# Patient Record
Sex: Female | Born: 1990 | Race: Black or African American | Hispanic: No | Marital: Married | Smoking: Never smoker
Health system: Southern US, Community
[De-identification: ages and names within clinical notes are randomized; demographics above are authoritative.]

## PROBLEM LIST (undated history)

## (undated) DIAGNOSIS — R7871 Abnormal lead level in blood: Secondary | ICD-10-CM

## (undated) DIAGNOSIS — O1490 Unspecified pre-eclampsia, unspecified trimester: Secondary | ICD-10-CM

## (undated) DIAGNOSIS — K0889 Other specified disorders of teeth and supporting structures: Secondary | ICD-10-CM

## (undated) HISTORY — DX: Abnormal lead level in blood: R78.71

## (undated) HISTORY — DX: Unspecified pre-eclampsia, unspecified trimester: O14.90

## (undated) HISTORY — DX: Other specified disorders of teeth and supporting structures: K08.89

---

## 2019-07-16 HISTORY — DX: Maternal care for unspecified type scar from previous cesarean delivery: O34.219

## 2019-10-27 ENCOUNTER — Emergency Department (HOSPITAL_COMMUNITY)
Admission: EM | Admit: 2019-10-27 | Discharge: 2019-10-27 | Disposition: A | Discharge status: Home / Self Care | Payer: Medicaid Other | Attending: Emergency Medicine | Admitting: Emergency Medicine

## 2019-10-27 ENCOUNTER — Encounter (HOSPITAL_COMMUNITY): Payer: Self-pay

## 2019-10-27 ENCOUNTER — Other Ambulatory Visit: Payer: Self-pay

## 2019-10-27 DIAGNOSIS — K0889 Other specified disorders of teeth and supporting structures: Secondary | ICD-10-CM | POA: Diagnosis not present

## 2019-10-27 DIAGNOSIS — Z3A01 Less than 8 weeks gestation of pregnancy: Secondary | ICD-10-CM | POA: Insufficient documentation

## 2019-10-27 DIAGNOSIS — O26891 Other specified pregnancy related conditions, first trimester: Secondary | ICD-10-CM | POA: Insufficient documentation

## 2019-10-27 LAB — I-STAT BETA HCG BLOOD, ED (MC, WL, AP ONLY): I-stat hCG, quantitative: >2000 m[IU]/mL — ABNORMAL HIGH (ref <5)

## 2019-10-27 NOTE — Discharge Instructions (Addendum)
You were seen in the ER for tooth pain. I did not see any signs of infection in your mouth, you most likely irritated a crown or a nerve while you were eating. Continue to take over the counter Tylenol for pain. Do not take Ibuprofen/ Advil for pain as this is dangerous in pregnancy. I have provided a phone number and address for a Dentist in [facility]. Please schedule an appointment as soon as possible. You also received a blood pregnancy test here in the ER. You may check the results via MyChart. I have referred you to a women's clinic here in [place]. Please call and schedule an appointment today. Return to the ER if your symptoms worsen.

## 2019-10-27 NOTE — ED Triage Notes (Signed)
Pt reports 2 weeks of left upper dental pain, states she is about [redacted] weeks pregnant, no complaints related to pregnancy

## 2019-10-27 NOTE — ED Notes (Signed)
Patient given discharge instructions patient verbalizes understanding. 

## 2019-10-27 NOTE — ED Provider Notes (Signed)
[place] EMERGENCY DEPARTMENT Provider Note   CSN: [id number] Arrival date & time: 10/27/19  1134     History Chief Complaint  Patient presents with  . Dental Pain    Kathryn Rollins is a 29 y.o. female.  HPI 29 year old female with no significant medical history presents ER for tooth pain and stating that she is approximately [redacted] weeks pregnant.  History provided by patient and husband over the phone.  She states that on Sunday she was eating, felt like bit down on her food wrong, and since then has been having left-sided known localized jaw pain.  She cannot locate it to a specific area, states that it is in both upper and lower jaw.  She denies any fevers, chills, drooling, difficulty swallowing, headaches, ear pain.  She states that she is approximately [redacted] weeks pregnant per home pregnancy test, and does not have an OB/GYN that she follows with.  LMP 09/19/2019.  She denies any abdominal pain, abnormal vaginal bleeding, discharge, pain, nausea, vomiting.       History reviewed. No pertinent past medical history.  There are no problems to display for this patient.   History reviewed. No pertinent surgical history.   OB History    Gravida  1   Para      Term      Preterm      AB      Living        SAB      TAB      Ectopic      Multiple      Live Births              No family history on file.  Social History   Tobacco Use  . Smoking status: Not on file  Substance Use Topics  . Alcohol use: Not on file  . Drug use: Not on file    Home Medications Prior to Admission medications   Medication Sig Start Date End Date Taking? Authorizing Provider  acetaminophen (TYLENOL) 500 MG tablet Take 500 mg by mouth every 6 (six) hours as needed for mild pain.   Yes [provider]    Allergies    Patient has no known allergies.  Review of Systems   Review of Systems  Constitutional: Negative for chills, fatigue and fever.    HENT: Positive for dental problem. Negative for drooling, ear pain, facial swelling, mouth sores, sinus pain, sore throat, trouble swallowing and voice change.   Respiratory: Negative for cough and shortness of breath.   Cardiovascular: Negative for chest pain, palpitations and leg swelling.  Gastrointestinal: Negative for abdominal pain, diarrhea, nausea and vomiting.  Genitourinary: Negative for dysuria, menstrual problem, pelvic pain, vaginal bleeding, vaginal discharge and vaginal pain.  Musculoskeletal: Negative for back pain.  Skin: Negative for wound.  Neurological: Negative for speech difficulty.    Physical Exam Updated Vital Signs BP 111/78 (BP Location: Right Arm)   Pulse (!) 108   Temp 98.7 F (37.1 C) (Oral)   Resp 12   LMP 09/28/2019 (Approximate)   SpO2 99%   Physical Exam Constitutional:      General: She is not in acute distress.    Appearance: Normal appearance. She is normal weight. She is not ill-appearing or toxic-appearing.  HENT:     Head: Normocephalic and atraumatic.     Ears:     Comments: No mastoid tenderness bilaterally    Mouth/Throat:     Lips: Pink.  Mouth: Mucous membranes are moist. No lacerations, oral lesions or angioedema.     Dentition: Abnormal dentition. Does not have dentures. Dental tenderness present. No gingival swelling, dental caries or dental abscesses.     Tongue: No lesions. Tongue does not deviate from midline.     Palate: No mass and lesions.     Pharynx: No pharyngeal swelling, oropharyngeal exudate, posterior oropharyngeal erythema or uvula swelling.     Tonsils: No tonsillar exudate or tonsillar abscesses.     Comments: Moderate dentition, patient missing several teeth.  Noticeable crown on tooth #17, missing tooth 18, 19, 32, 30.  No noticeable dental caries, rotting teeth, erythema along gumline.  No pus, wounds, abscesses, drainage.  Gums pink and moist, no gingival swelling, no signs of infection.  Uvula midline, no  tonsillar swelling or exudate.  No wounds, ulcerations to outer cheeks bilaterally.  Tongue moist, pink, with no lesions.  No parotid submandibular swelling.  No dentures.  Patient tolerating secretions well.  Diffuse tenderness to left lower and upper jaw  Eyes:     Extraocular Movements: Extraocular movements intact.     Conjunctiva/sclera: Conjunctivae normal.     Pupils: Pupils are equal, round, and reactive to light.  Cardiovascular:     Rate and Rhythm: Normal rate and regular rhythm.     Pulses: Normal pulses.     Heart sounds: Normal heart sounds.  Pulmonary:     Effort: Pulmonary effort is normal.     Breath sounds: Normal breath sounds.  Abdominal:     General: Abdomen is flat.     Palpations: Abdomen is soft.     Tenderness: There is no abdominal tenderness. There is no guarding.  Musculoskeletal:        General: Normal range of motion.  Skin:    General: Skin is warm and dry.     Findings: No bruising or lesion.  Neurological:     General: No focal deficit present.     Mental Status: She is alert.  Psychiatric:        Mood and Affect: Mood normal.        Behavior: Behavior normal.     ED Results / Procedures / Treatments   Labs (all labs ordered are listed, but only abnormal results are displayed) Labs Reviewed  I-STAT BETA HCG BLOOD, ED ([facility], [facility], [facility] ONLY) - Abnormal; Notable for the following components:      Result Value   I-stat hCG, quantitative >2,000.0 (*)    All other components within normal limits    EKG None  Radiology No results found.  Procedures Procedures (including critical care time)  Medications Ordered in ED Medications - No data to display  ED Course  I have reviewed the triage vital signs and the nursing notes.  Pertinent labs & imaging results that were available during my care of the patient were reviewed by me and considered in my medical decision making (see chart for details).    MDM Rules/Calculators/A&P                       29 year old female with tooth pain and new pregnancy. On presentation to the ER, patient is well-appearing, no acute distress, vitals nonconcerning.  There is no tripoding, drooling, patient able to speak in full sentences, tolerating secretions well.  On inspection, I do not see any fluctuance, erythema along gumlines, no signs of tooth infection in upper and the lower left side of her jaw.  Doubt dental abscess, severe infection, Ludwig's Angina, pertionsillar abscess.  Patient denies injury to jaw.  Suspect nerve irritation/possible muscle injury due to chewing.  I do not think antibiotics are indicated at this time.  Patient states that she has been taking Tylenol for pain which has been helpful.  Encouraged her to continue to take this, warned her about NSAID use in pregnancy.  Will refer to dentist for further treatment, return precautions given.  Patient requesting pregnancy test in ED.  Ordered i-STAT blood pregnancy test pending.  Patient informed she can look-up results in MyChart.  Patient does not endorse any abnormal vaginal bleeding, vaginal discharge, fluid rush, abdominal pain, excessive nausea or vomiting.  No red flags indicating threatened pregnancy.  LMP 09/19/2019.  Positive home pregnancy test. Will refer patient to outpatient women's clinic for further management of her pregnancy.     At this stage the patient has been appropriately medically screened and stable for discharge. She voices understanding and is agreable to this plan.  Final Clinical Impression(s) / ED Diagnoses Final diagnoses:  Pain, dental  Less than [redacted] weeks gestation of pregnancy    Rx / DC Orders ED Discharge Orders    None       Leone Brand 10/27/19 1315    Margarita Grizzle, MD 10/28/19 [phone]

## 2019-10-28 ENCOUNTER — Telehealth: Payer: Self-pay | Admitting: *Deleted

## 2019-10-28 ENCOUNTER — Telehealth: Payer: Self-pay | Admitting: Family Medicine

## 2019-10-28 NOTE — Telephone Encounter (Signed)
Pt husband called regarding Rx for wife.  RNCM reviewed chart to find that there was not Rx written for pt.  RNCM relayed information to husband.

## 2019-10-28 NOTE — Telephone Encounter (Signed)
Received a call from the patient's husband. He wanted her to see a doctor today because she has a toothache, and the dentist will not see her without a note. I informed him due to her being so early pregnant, we could not see her today to give her a note. However, we could get her scheduled to start care at 8 weeks.

## 2019-10-30 ENCOUNTER — Other Ambulatory Visit: Payer: Self-pay

## 2019-10-30 ENCOUNTER — Emergency Department (HOSPITAL_COMMUNITY)
Admission: EM | Admit: 2019-10-30 | Discharge: 2019-10-30 | Disposition: A | Discharge status: Home / Self Care | Payer: Medicaid Other | Attending: Emergency Medicine | Admitting: Emergency Medicine

## 2019-10-30 ENCOUNTER — Encounter (HOSPITAL_COMMUNITY): Payer: Self-pay

## 2019-10-30 DIAGNOSIS — O99891 Other specified diseases and conditions complicating pregnancy: Secondary | ICD-10-CM | POA: Diagnosis present

## 2019-10-30 DIAGNOSIS — K0889 Other specified disorders of teeth and supporting structures: Secondary | ICD-10-CM | POA: Insufficient documentation

## 2019-10-30 DIAGNOSIS — R519 Headache, unspecified: Secondary | ICD-10-CM | POA: Insufficient documentation

## 2019-10-30 DIAGNOSIS — Z3A01 Less than 8 weeks gestation of pregnancy: Secondary | ICD-10-CM | POA: Insufficient documentation

## 2019-10-30 MED ORDER — ACETAMINOPHEN 500 MG PO TABS
1000.0000 mg | ORAL_TABLET | Freq: Once | ORAL | Status: AC
  Administered 2019-10-30: 17:00:00 1000 mg via ORAL
  Filled 2019-10-30: qty 2

## 2019-10-30 NOTE — ED Triage Notes (Addendum)
Patient complains of ongoing dental pain left upper for more than 2 weeks, also reports early pregnancy approximately 4-5 weeks. Interpretor service used for triage assessment. Patient has taken antibiotic as prescribed and the dentist want see until cleared by OB

## 2019-10-30 NOTE — ED Provider Notes (Signed)
[facility] EMERGENCY DEPARTMENT Provider Note   CSN: [id number] Arrival date & time: 10/30/19  1414     History Chief Complaint  Patient presents with  . Dental Pain    Kathryn Rollins is a 29 y.o. female.  Kathryn Rollins is a 29 y.o. female who is currently 4-[redacted] weeks pregnant, presents to the emergency department with continued dental pain.  She states she has been dealing with this pain for 3 weeks now.  She was seen in the emergency department a few days ago and instructed to take Tylenol and follow-up with a dentist.  The dentist started her on amoxicillin, but stated that she would need OB clearance before they could do a dental extraction.  Patient reports she called the faculty practice women's clinic but they were not able to schedule an appointment for her to be seen yet because she is too early in her pregnancy and stated she would need to be at least [redacted] weeks along.  She is here today because she does not think she can continue to deal with this pain for 4 more weeks until she can be seen by OB/GYN.  She denies any associated facial swelling, fevers or chills.  Tylenol does provide some relief until it wears off.  She last took a dose this morning.  She has been taking the amoxicillin as directed.  She reports pain radiates up the side of her face.  She has not had any difficulty swallowing or pain or swelling underneath the tongue.  No other aggravating or alleviating factors.  Patient is [place] speaking, interpreter used to obtain history        History reviewed. No pertinent past medical history.  There are no problems to display for this patient.   History reviewed. No pertinent surgical history.   OB History    Gravida  1   Para      Term      Preterm      AB      Living        SAB      TAB      Ectopic      Multiple      Live Births              No family history on file.  Social History   Tobacco Use  . Smoking  status: Not on file  Substance Use Topics  . Alcohol use: Not on file  . Drug use: Not on file    Home Medications Prior to Admission medications   Medication Sig Start Date End Date Taking? Authorizing Provider  acetaminophen (TYLENOL) 500 MG tablet Take 500 mg by mouth every 6 (six) hours as needed for mild pain.    [provider]    Allergies    Patient has no known allergies.  Review of Systems   Review of Systems  Constitutional: Negative for chills and fever.  HENT: Positive for dental problem. Negative for facial swelling, sore throat, trouble swallowing and voice change.   Musculoskeletal: Negative for neck pain and neck stiffness.  Skin: Negative for color change and rash.  Neurological: Positive for headaches.    Physical Exam Updated Vital Signs BP 105/66 (BP Location: Right Arm)   Pulse 90   Temp 98.4 F (36.9 C) (Oral)   Resp 14   LMP 09/28/2019 (Approximate)   SpO2 100%   Physical Exam Vitals and nursing note reviewed.  Constitutional:  General: She is not in acute distress.    Appearance: Normal appearance. She is well-developed and normal weight. She is not ill-appearing or diaphoretic.     Comments: Tearful but in no acute distress  HENT:     Head: Normocephalic and atraumatic.     Mouth/Throat:     Comments: Tenderness to palpation over the left upper posterior molar with no surrounding erythema, swelling or obvious abscess, no tenderness over the roof of the mouth, posterior oropharynx is clear, normal phonation, tolerating secretions without difficulty, no sublingual tenderness or induration. Eyes:     General:        Right eye: No discharge.        Left eye: No discharge.  Neck:     Comments: No neck pain, swelling or masses, normal range of motion, no torticollis, no stridor Pulmonary:     Effort: Pulmonary effort is normal. No respiratory distress.  Musculoskeletal:     Cervical back: Neck supple.  Neurological:     Mental  Status: She is alert and oriented to person, place, and time.     Coordination: Coordination normal.  Psychiatric:        Behavior: Behavior normal.     ED Results / Procedures / Treatments   Labs (all labs ordered are listed, but only abnormal results are displayed) Labs Reviewed - No data to display  EKG None  Radiology No results found.  Procedures Procedures (including critical care time)  Medications Ordered in ED Medications  acetaminophen (TYLENOL) tablet 1,000 mg (has no administration in time range)    ED Course  I have reviewed the triage vital signs and the nursing notes.  Pertinent labs & imaging results that were available during my care of the patient were reviewed by me and considered in my medical decision making (see chart for details).    MDM Rules/Calculators/A&P                     29 year old female here with continued dental pain for 3 weeks, was seen by dentist and started on amoxicillin, and on exam today she has no obvious signs of abscess, facial swelling, fevers or chills, exam is not concerning for Ludwick's angina.  Dentist told patient that she needed OB clearance before they could do an extraction, but patient has been told by OB/GYN office that she cannot be seen until she is at least [redacted] weeks along, and is currently only 4 to 5 weeks.  I discussed with MAU provider Vernice Jefferson who provided a list of other dentist for the patient to consult and also encourage patient to call back the OB/GYN office and explain the situation, she states they are usually able to provide patients with a letter with dental clearance and approve medications during pregnancy.  I provided patient with dental resource list pain gave her this information had long discussion using [place] interpreter and answered all possible questions.  Pain has improved with a dose of Tylenol here in the emergency department.  At this time patient is stable for discharge home.  Final  Clinical Impression(s) / ED Diagnoses Final diagnoses:  Pain, dental  Less than [redacted] weeks gestation of pregnancy    Rx / DC Orders ED Discharge Orders    None       Janet Berlin 10/30/19 1746    Quintella Reichert, MD 10/31/19 [phone]

## 2019-10-30 NOTE — ED Notes (Signed)
Patient verbalizes understanding of discharge instructions. Opportunity for questioning and answers were provided. Armband removed by staff, pt discharged from ED and ambulated to lobby to wait for family. 

## 2019-10-30 NOTE — Discharge Instructions (Signed)
Please continue taking your antibiotics and continue using Tylenol 1000 mg 3 times daily.  Call the OB/GYN office and explained to them that you need a dental procedure done but cannot be seen without dental clearance, you can also use the list of dental resources provided to try and find another dentist that can see you.

## 2019-12-06 ENCOUNTER — Other Ambulatory Visit: Payer: Self-pay

## 2019-12-06 ENCOUNTER — Ambulatory Visit (INDEPENDENT_AMBULATORY_CARE_PROVIDER_SITE_OTHER): Payer: Self-pay | Admitting: *Deleted

## 2019-12-06 DIAGNOSIS — Z789 Other specified health status: Secondary | ICD-10-CM

## 2019-12-06 DIAGNOSIS — O09299 Supervision of pregnancy with other poor reproductive or obstetric history, unspecified trimester: Secondary | ICD-10-CM

## 2019-12-06 DIAGNOSIS — Z3A00 Weeks of gestation of pregnancy not specified: Secondary | ICD-10-CM

## 2019-12-06 DIAGNOSIS — R7871 Abnormal lead level in blood: Secondary | ICD-10-CM

## 2019-12-06 DIAGNOSIS — Z603 Acculturation difficulty: Secondary | ICD-10-CM

## 2019-12-06 DIAGNOSIS — O0990 Supervision of high risk pregnancy, unspecified, unspecified trimester: Secondary | ICD-10-CM

## 2019-12-06 NOTE — Progress Notes (Signed)
I connected with  Kathryn Rollins on 12/06/19 at  9:15 AM EDT by telephone and verified that I am speaking with the correct person using two identifiers.   I discussed the limitations, risks, security and privacy concerns of performing an evaluation and management service by telephone and the availability of in person appointments. I also discussed with the patient that there may be a patient responsible charge related to this service. The patient expressed understanding and agreed to proceed.  I explained I am completing her New OB Intake today. We discussed per chart she has also scheduled appointment with [facility].  I confirmed she is going to get her prenatal care with [place] , not [facility]- she states she cancelled with [facility]. We discussed Her EDD and that it is based on  sure LMP . I reviewed her allergies, meds, OB History, Medical /Surgical history, and appropriate screenings.She is unsure of her pre- pregnancy weight.  I explained we will ask her to take her blood pressure weekly during her pregnancy. She does not have a cuff. She has no insurance but is going to apply  For medicaid. I Informed her we can order a blood pressure cuff prescription thru  [facility] once she has medicaid.   I explained she will have some visits in office and some virtually and that we will help her download the app when she is in the office next week.   I reviewed her new ob  appointment date/ time with her , our location and to wear mask, no visitors.  I explained she will have a pelvic exam, ob bloodwork, hemoglobin a1C, cbg , and  genetic testing if desired,- she is undecided about a panorama.( she had pap in 2019 )  I scheduled an [place] at 19 weeks and gave her the appointment. She voices understanding.   Tela Kotecki,RN 12/06/2019  9:34 AM

## 2019-12-06 NOTE — Patient Instructions (Signed)

## 2019-12-14 ENCOUNTER — Other Ambulatory Visit (HOSPITAL_COMMUNITY)
Admission: RE | Admit: 2019-12-14 | Discharge: 2019-12-14 | Disposition: A | Discharge status: Home / Self Care | Payer: Medicaid Other | Source: Ambulatory Visit | Attending: Family Medicine | Admitting: Family Medicine

## 2019-12-14 ENCOUNTER — Ambulatory Visit (INDEPENDENT_AMBULATORY_CARE_PROVIDER_SITE_OTHER): Payer: Medicaid Other | Admitting: Family Medicine

## 2019-12-14 ENCOUNTER — Encounter: Payer: Self-pay | Admitting: *Deleted

## 2019-12-14 ENCOUNTER — Other Ambulatory Visit: Payer: Self-pay

## 2019-12-14 ENCOUNTER — Encounter: Payer: Self-pay | Admitting: Family Medicine

## 2019-12-14 VITALS — BP 110/70 | HR 101 | Ht 61.0 in | Wt 168.1 lb

## 2019-12-14 DIAGNOSIS — O0990 Supervision of high risk pregnancy, unspecified, unspecified trimester: Secondary | ICD-10-CM | POA: Insufficient documentation

## 2019-12-14 DIAGNOSIS — O0991 Supervision of high risk pregnancy, unspecified, first trimester: Secondary | ICD-10-CM

## 2019-12-14 DIAGNOSIS — E6609 Other obesity due to excess calories: Secondary | ICD-10-CM

## 2019-12-14 DIAGNOSIS — Z98891 History of uterine scar from previous surgery: Secondary | ICD-10-CM

## 2019-12-14 DIAGNOSIS — Z148 Genetic carrier of other disease: Secondary | ICD-10-CM

## 2019-12-14 DIAGNOSIS — O99211 Obesity complicating pregnancy, first trimester: Secondary | ICD-10-CM

## 2019-12-14 DIAGNOSIS — Z789 Other specified health status: Secondary | ICD-10-CM

## 2019-12-14 DIAGNOSIS — R7871 Abnormal lead level in blood: Secondary | ICD-10-CM

## 2019-12-14 DIAGNOSIS — Z6831 Body mass index (BMI) 31.0-31.9, adult: Secondary | ICD-10-CM

## 2019-12-14 DIAGNOSIS — Z3A12 12 weeks gestation of pregnancy: Secondary | ICD-10-CM

## 2019-12-14 DIAGNOSIS — D563 Thalassemia minor: Secondary | ICD-10-CM

## 2019-12-14 DIAGNOSIS — Z603 Acculturation difficulty: Secondary | ICD-10-CM

## 2019-12-14 DIAGNOSIS — O09299 Supervision of pregnancy with other poor reproductive or obstetric history, unspecified trimester: Secondary | ICD-10-CM

## 2019-12-14 LAB — POCT URINALYSIS DIP (DEVICE)
Glucose, UA: NEGATIVE mg/dL
Hgb urine dipstick: NEGATIVE
Leukocytes,Ua: NEGATIVE
Nitrite: NEGATIVE
Protein, ur: NEGATIVE mg/dL
Specific Gravity, Urine: >=1.03 (ref 1.005–1.030)
Urobilinogen, UA: 0.2 mg/dL (ref 0.0–1.0)
pH: 5.5 (ref 5.0–8.0)

## 2019-12-14 MED ORDER — ASPIRIN EC 81 MG PO TBEC: 81.0000 mg | DELAYED_RELEASE_TABLET | Freq: Every day | ORAL | 2 refills | Status: DC

## 2019-12-14 NOTE — Progress Notes (Signed)
History:   Kathryn Rollins is a 29 y.o. G3P1011 at [redacted]w[redacted]d by LMP being seen today for her first obstetrical visit.  Her obstetrical history is significant for pre-eclampsia. Patient does intend to breast feed. Pregnancy history fully reviewed.  Patient reports mild nausea without vomiting but otherwise no complaints.       HISTORY: OB History  Gravida Para Term Preterm AB Living  3 1 1  0 1 1  SAB TAB Ectopic Multiple Live Births  0 0 0 0 1    # Outcome Date GA Lbr Len/2nd Weight Sex Delivery Anes PTL Lv  3 Current           2 Term 04/28/18 [redacted]w[redacted]d  3 lb 15.5 oz (1.8 kg) F CS-Unspec   LIV     Birth Comments: baby to NICU; had postpartum hypertension- had prenatal care [facility] then moved to [facility]  1 AB 2011            Last pap smear was done Feb 2019 and was normal (Available in Care Everywhere).   Past Medical History:  Diagnosis Date  . Elevated blood lead level   . Preeclampsia   . Tooth pain    Past Surgical History:  Procedure Laterality Date  . CESAREAN SECTION     History reviewed. No pertinent family history. Social History   Tobacco Use  . Smoking status: Never Smoker  . Smokeless tobacco: Never Used  Substance Use Topics  . Alcohol use: Never  . Drug use: Never   No Known Allergies Current Outpatient Medications on File Prior to Visit  Medication Sig Dispense Refill  . acetaminophen (TYLENOL) 500 MG tablet Take 500 mg by mouth every 6 (six) hours as needed for mild pain.    . Prenatal Vit-Fe Fumarate-FA (PRENATAL VITAMIN PO) Take 1 tablet by mouth daily.     No current facility-administered medications on file prior to visit.    Review of Systems Pertinent items noted in HPI and remainder of comprehensive ROS otherwise negative. Physical Exam:   Vitals:   12/14/19 0859 12/14/19 0904  BP: 110/70   Pulse: (!) 101   Weight: 168 lb 1.6 oz (76.2 kg)   Height:  5\' 1"  (1.549 m)   Fetal Heart Rate (bpm): 159  CONSTITUTIONAL: Well-developed,  well-nourished female in no acute distress.  HEENT:  Normocephalic, atraumatic. External right and left ear normal. No scleral icterus.  NECK: Normal range of motion SKIN: No rash noted. Not diaphoretic. No erythema. No pallor. MUSCULOSKELETAL: Normal range of motion. No edema noted. NEUROLOGIC: Alert and oriented to person, place, and time. Normal muscle tone coordination. No cranial nerve deficit noted. PSYCHIATRIC: Normal mood and affect. Normal behavior. Normal judgment and thought content. CARDIOVASCULAR: Mild tachycardia  RESPIRATORY: Effort  normal, no problems with respiration noted ABDOMEN: Soft, non-tender. PELVIC: Normal appearing external genitalia; normal appearing vaginal mucosa and cervix.  No abnormal discharge noted.  Normal uterine size, no other palpable masses, no uterine or adnexal tenderness.  Assessment:    Pregnancy: G3P1011 Patient Active Problem List   Diagnosis Date Noted  . History of cesarean section 12/14/2019  . Supervision of high risk pregnancy, antepartum 12/06/2019  . History of pre-eclampsia in prior pregnancy, currently pregnant 12/06/2019  . Language barrier 12/06/2019  . Elevated blood lead level      Plan:  [name] was seen today for new ob intake.  Diagnoses and all orders for this visit:  Supervision of high risk pregnancy, antepartum -  Culture, OB Urine -     Hemoglobin A1c -     Cervicovaginal ancillary only( [place]) -     Comprehensive metabolic panel -     Protein / creatinine ratio, urine -     aspirin EC 81 MG tablet; Take 1 tablet (81 mg total) by mouth daily. Take after 12 weeks for prevention of preeclampsia later in pregnancy -     CBC/D/Plt+RPR+Rh+ABO+Rub Ab... -     Genetic Screening  History of pre-eclampsia in prior pregnancy, currently pregnant       - ASA ordered, CMP and Pr/Cr  Language barrier       - Live [place] interpreter used   History of cesarean section       - Reviewed Op Report in Care  Everywhere. PLTCS for NRFHT with mild Pre-E. Okay for TOLAC. Patient desires TOLAC. Discuss further when closer to delivery.  Elevated blood lead level -     Lead, blood (adult age 36 yrs or greater) -  Hx of elevated lead level with first pregnancy. Will recheck. No tubes available today but can draw next time.  Class 1 obesity due to excess calories without serious comorbidity with body mass index (BMI) of 31.0 to 31.9 in adult       - Appropriate weight gain. ASA for Pre-E ppx.  Other orders -     POCT urinalysis dip (device)  Initial labs drawn. Continue prenatal vitamins. Problem list reviewed and updated. Genetic Screening discussed, NIPS: ordered. AFP at next visit.  Ultrasound discussed; fetal anatomic survey: ordered. Discussed usage of Babyscripts and virtual visits as additional source of managing and completing prenatal visits in midst of coronavirus and pandemic.   Anticipatory guidance for prenatal visits including labs, ultrasounds, and testing; Initial labs drawn. Encouraged to complete MyChart Registration for her ability to review results, send requests, and have questions addressed.  The nature of [facility] Healthcare/Faculty Practice with multiple MDs and Advanced Practice Providers was explained to patient; also emphasized that residents, students are part of our team. Routine obstetric precautions reviewed. Encouraged to seek out care at office or emergency room [facility] preferred) for urgent and/or emergent concerns. Return in about 4 weeks (around 01/11/2020) for HROB: in-person with AFP.    Jerilynn Birkenhead, MD [facility] Family Medicine Fellow, [facility] for [employer], [facility]

## 2019-12-14 NOTE — Progress Notes (Signed)
Here for new ob visit. Had intake done. Has not applied for Pregnancy medicaid yet.

## 2019-12-14 NOTE — Patient Instructions (Signed)
BENEFITS OF BREASTFEEDING Many women wonder if they should breastfeed. Research shows that breast milk contains the perfect balance of vitamins, protein and fat that your baby needs to grow. It also contains antibodies that help your baby's immune system to fight off viruses and bacteria and can reduce the risk of sudden infant death syndrome (SIDS). In addition, the colostrum (a fluid secreted from the breast in the first few days after delivery) helps your newborn's digestive system to grow and function well. Breast milk is easier to digest than formula. Also, if your baby is born preterm, breast milk can help to reduce both short- and long-term health problems. BENEFITS OF BREASTFEEDING FOR MOM . Breastfeeding causes a hormone to be released that helps the uterus to contract and return to its normal size more quickly. . It aids in postpartum weight loss, reduces risk of breast and ovarian cancer, heart disease and rheumatoid arthritis. . It decreases the amount of bleeding after the baby is born. benefits of breastfeeding for baby . Provides comfort and nutrition . Protects baby against - Obesity - Diabetes - Asthma - Childhood cancers - Heart disease - Ear infections - Diarrhea - Pneumonia - Stomach problems - Serious allergies - Skin rashes . Promotes growth and development . Reduces the risk of baby having Sudden Infant Death Syndrome (SIDS) only breastmilk for the first 6 months . Protects baby against diseases/allergies . It's the perfect amount for tiny bellies . It restores baby's energy . Provides the best nutrition for baby . Giving water or formula can make baby more likely to get sick, decrease Mom's milk supply, make baby less content with breastfeeding Skin to Skin After delivery, the staff will place your baby on your chest. This helps with the following: . Regulates baby's temperature, breathing, heart rate and blood sugar . Increases Mom's milk supply . Promotes  bonding . Keeps baby and Mom calm and decreases baby's crying Rooming In Your baby will stay in your room with you for the entire time you are in the hospital. This helps with the following: . Allows Mom to learn baby's feeding cues - Fluttering eyes - Sucking on tongue or hand - Rooting (opens mouth and turns head) - Nuzzling into the breast - Bringing hand to mouth . Allows breastfeeding on demand (when your baby is ready) . Helps baby to be calm and content . Ensures a good milk supply . Prevents complications with breastfeeding . Allows parents to learn to care for baby . Allows you to request assistance with breastfeeding Importance of a good latch . Increases milk transfer to baby - baby gets enough milk . Ensures you have enough milk for your baby . Decreases nipple soreness . Don't use pacifiers and bottles - these cause baby to suck differently than breastfeeding . Promotes continuation of breastfeeding Risks of Formula Supplementation with Breastfeeding Giving your infant formula in addition to your breast-milk EXCEPT when medically necessary can lead to: . Decreases your milk supply  . Loss of confidence in yourself for providing baby's nutrition  . Engorgement and possibly mastitis  . Asthma & allergies in the baby BREASTFEEDING FAQS How long should I breastfeed my baby? It is recommended that you provide your baby with breast milk only for the first 6 months and then continue for the first year and longer as desired. During the first few weeks after birth, your baby will need to feed 8-12 times every 24 hours, or every 2-3 hours. They will likely feed   for 15-30 minutes. How can I help my baby begin breastfeeding? Babies are born with an instinct to breastfeed. A healthy baby can begin breastfeeding right away without specific help. At the hospital, a nurse (or lactation consultant) will help you begin the process and will give you tips on good positioning. It may be  helpful to take a breastfeeding class before you deliver in order to know what to expect. How can I help my baby latch on? In order to assist your baby in latching-on, cup your breast in your hand and stroke your baby's lower lip with your nipple to stimulate your baby's rooting reflex. Your baby will look like he or she is yawning, at which point you should bring the baby towards your breast, while aiming the nipple at the roof of his or her mouth. Remember to bring the baby towards you and not your breast towards the baby. How can I tell if my baby is latched-on? Your baby will have all of your nipple and part of the dark area around the nipple in his or her mouth and your baby's nose will be touching your breast. You should see or hear the baby swallowing. If the baby is not latched-on properly, start the process over. To remove the suction, insert a clean finger between your breast and the baby's mouth. Should I switch breasts during feeding? After feeding on one side, switch the baby to your other breast. If he or she does not continue feeding - that is OK. Your baby will not necessarily need to feed from both breasts in a single feeding. On the next feeding, start with the other breast for efficiency and comfort. How can I tell if my baby is hungry? When your baby is hungry, they will nuzzle against your breast, make sucking noises and tongue motions and may put their hands near their mouth. Crying is a late sign of hunger, so you should not wait until this point. When they have received enough milk, they will unlatch from the breast. Is it okay to use a pacifier? Until your baby gets the hang of breastfeeding, experts recommend limiting pacifier usage. If you have questions about this, please contact your pediatrician. What can I do to ensure proper nutrition while breastfeeding? . Make sure that you support your own health and your baby's by eating a healthy, well-balanced diet . Your provider  may recommend that you continue to take your prenatal vitamin . Drink plenty of fluids. It is a good rule to drink one glass of water before or after feeding . Alcohol will remain in the breast milk for as long as it will remain in the blood stream. If you choose to have a drink, it is recommended that you wait at least 2 hours before feeding . Moderate amounts of caffeine are OK . Some over-the-counter or prescription medications are not recommended during breastfeeding. Check with your provider if you have questions What types of birth control methods are safe while breastfeeding? Progestin-only methods, including a daily pill, an IUD, the implant and the injection are safe while breastfeeding. Methods that contain estrogen (such as combination birth control pills, the vaginal ring and the patch) should not be used during the first month of breastfeeding as these can decrease your milk supply.  Safe Medications in Pregnancy   Acne:  Benzoyl Peroxide  Salicylic Acid   Backache/Headache:  Tylenol: 2 regular strength every 4 hours OR        2 Extra  strength every 6 hours   Colds/Coughs/Allergies:  Benadryl (alcohol free) 25 mg every 6 hours as needed  Breath right strips  Claritin  Cepacol throat lozenges  Chloraseptic throat spray  Cold-Eeze- up to three times per day  Cough drops, alcohol free  Flonase (by prescription only)  Guaifenesin  Mucinex  Robitussin DM (plain only, alcohol free)  Saline nasal spray/drops  Sudafed (pseudoephedrine) & Actifed * use only after [redacted] weeks gestation and if you do not have high blood pressure  Tylenol  Vicks Vaporub  Zinc lozenges  Zyrtec   Constipation:  Colace  Ducolax suppositories  Fleet enema  Glycerin suppositories  Metamucil  Milk of magnesia  Miralax  Senokot  Smooth move tea   Diarrhea:  Kaopectate  Imodium A-D   *NO pepto Bismol   Hemorrhoids:  Anusol  Anusol HC  Preparation H  Tucks   Indigestion:  Tums   Maalox  Mylanta  Zantac  Pepcid   Insomnia:  Benadryl (alcohol free) 25mg  every 6 hours as needed  Tylenol PM  Unisom, no Gelcaps   Leg Cramps:  Tums  MagGel   Nausea/Vomiting:  Bonine  Dramamine  Emetrol  Ginger extract  Sea bands  Meclizine  Nausea medication to take during pregnancy:  Unisom (doxylamine succinate 25 mg tablets) Take one tablet daily at bedtime. If symptoms are not adequately controlled, the dose can be increased to a maximum recommended dose of two tablets daily (1/2 tablet in the morning, 1/2 tablet mid-afternoon and one at bedtime).  Vitamin B6 100mg  tablets. Take one tablet twice a day (up to 200 mg per day).   Skin Rashes:  Aveeno products  Benadryl cream or 25mg  every 6 hours as needed  Calamine Lotion  1% cortisone cream   Yeast infection:  Gyne-lotrimin 7  Monistat 7    **If taking multiple medications, please check labels to avoid duplicating the same active ingredients  **take medication as directed on the label  ** Do not exceed 4000 mg of tylenol in 24 hours  **Do not take medications that contain aspirin or ibuprofen

## 2019-12-15 LAB — CERVICOVAGINAL ANCILLARY ONLY
Bacterial Vaginitis (gardnerella): NEGATIVE
Candida Glabrata: NEGATIVE
Candida Vaginitis: NEGATIVE
Chlamydia: NEGATIVE
Comment: NEGATIVE
Comment: NEGATIVE
Comment: NEGATIVE
Comment: NEGATIVE
Comment: NEGATIVE
Comment: NORMAL
Neisseria Gonorrhea: NEGATIVE
Trichomonas: NEGATIVE

## 2019-12-15 LAB — CBC/D/PLT+RPR+RH+ABO+RUB AB...
Antibody Screen: NEGATIVE
Basophils Absolute: 0 10*3/uL (ref 0.0–0.2)
Basos: 0 %
EOS (ABSOLUTE): 0.1 10*3/uL (ref 0.0–0.4)
Eos: 1 %
HCV Ab: <0.1 s/co ratio (ref 0.0–0.9)
HIV Screen 4th Generation wRfx: NONREACTIVE
Hematocrit: 33.7 % — ABNORMAL LOW (ref 34.0–46.6)
Hemoglobin: 11.1 g/dL (ref 11.1–15.9)
Hepatitis B Surface Ag: NEGATIVE
Immature Grans (Abs): 0 10*3/uL (ref 0.0–0.1)
Immature Granulocytes: 0 %
Lymphocytes Absolute: 1.2 10*3/uL (ref 0.7–3.1)
Lymphs: 24 %
MCH: 29.6 pg (ref 26.6–33.0)
MCHC: 32.9 g/dL (ref 31.5–35.7)
MCV: 90 fL (ref 79–97)
Monocytes Absolute: 0.5 10*3/uL (ref 0.1–0.9)
Monocytes: 10 %
Neutrophils Absolute: 3.2 10*3/uL (ref 1.4–7.0)
Neutrophils: 65 %
Platelets: 196 10*3/uL (ref 150–450)
RBC: 3.75 x10E6/uL — ABNORMAL LOW (ref 3.77–5.28)
RDW: 12.7 % (ref 11.7–15.4)
RPR Ser Ql: NONREACTIVE
Rh Factor: POSITIVE
Rubella Antibodies, IGG: 9.21 index (ref >0.99)
WBC: 5 10*3/uL (ref 3.4–10.8)

## 2019-12-15 LAB — COMPREHENSIVE METABOLIC PANEL
ALT: 9 IU/L (ref 0–32)
AST: 13 IU/L (ref 0–40)
Albumin/Globulin Ratio: 1.4 (ref 1.2–2.2)
Albumin: 4 g/dL (ref 3.9–5.0)
Alkaline Phosphatase: 94 IU/L (ref 48–121)
BUN/Creatinine Ratio: 11 (ref 9–23)
BUN: 8 mg/dL (ref 6–20)
Bilirubin Total: 0.5 mg/dL (ref 0.0–1.2)
CO2: 19 mmol/L — ABNORMAL LOW (ref 20–29)
Calcium: 9.3 mg/dL (ref 8.7–10.2)
Chloride: 104 mmol/L (ref 96–106)
Creatinine, Ser: 0.7 mg/dL (ref 0.57–1.00)
GFR calc Af Amer: 137 mL/min/{1.73_m2} (ref >59)
GFR calc non Af Amer: 119 mL/min/{1.73_m2} (ref >59)
Globulin, Total: 2.8 g/dL (ref 1.5–4.5)
Glucose: 90 mg/dL (ref 65–99)
Potassium: 4.1 mmol/L (ref 3.5–5.2)
Sodium: 137 mmol/L (ref 134–144)
Total Protein: 6.8 g/dL (ref 6.0–8.5)

## 2019-12-15 LAB — HEMOGLOBIN A1C
Est. average glucose Bld gHb Est-mCnc: 88 mg/dL
Hgb A1c MFr Bld: 4.7 % — ABNORMAL LOW (ref 4.8–5.6)

## 2019-12-15 LAB — PROTEIN / CREATININE RATIO, URINE
Creatinine, Urine: 274.2 mg/dL
Protein, Ur: 20.9 mg/dL
Protein/Creat Ratio: 76 mg/g creat (ref 0–200)

## 2019-12-15 LAB — HCV INTERPRETATION

## 2019-12-15 NOTE — Progress Notes (Signed)
Addendum:  12/15/19 1638  Interpreter not used at appointment 12/14/19. Patient speaks good [place]. Offered to use interpreter on ipad or phone- patient declined.  Instructed patient to let us know if she did not understand anything or preferred interpreter. During visit patient answered questions appropriately, no communication issues noted.  Benjamyn Hestand,RN

## 2019-12-16 LAB — URINE CULTURE, OB REFLEX

## 2019-12-16 LAB — CULTURE, OB URINE

## 2019-12-23 ENCOUNTER — Telehealth: Payer: Self-pay | Admitting: *Deleted

## 2019-12-23 NOTE — Telephone Encounter (Signed)
I called Jaely with [employer] # [phone] and notified her per Dr. Morene Antu her ob bloodwork was normal. She voices understanding. She asked me to review each test which I did. Her husband asked about her prescription for Aspirin 81 mg. With her permission I explained ot Bernetha and her husband that the baby aspirin 81 mg was ordered and recommended because of her history of preeclampsia in a previous pregnancy and studies show taking aspirin 81 mg reduces risk of preeclampsia happening again. They voiced understanding.  I reviewed her new ob appointment day/time with her. She voices understanding.  Trenden Hazelrigg,RN

## 2019-12-23 NOTE — Telephone Encounter (Signed)
-----   Message from Joselyn Arrow, MD sent at 12/16/2019  9:54 AM EDT ----- Geannie Risen,   Hope you have a good day! Can you let this patient know about her normal OB initial labs?  Thanks! Chelsea

## 2020-01-11 ENCOUNTER — Encounter: Payer: Self-pay | Admitting: Obstetrics and Gynecology

## 2020-01-11 ENCOUNTER — Other Ambulatory Visit: Payer: Self-pay

## 2020-01-11 ENCOUNTER — Ambulatory Visit (INDEPENDENT_AMBULATORY_CARE_PROVIDER_SITE_OTHER): Payer: Medicaid Other | Admitting: Obstetrics and Gynecology

## 2020-01-11 ENCOUNTER — Telehealth (INDEPENDENT_AMBULATORY_CARE_PROVIDER_SITE_OTHER): Payer: Self-pay | Admitting: Lactation Services

## 2020-01-11 VITALS — BP 112/72 | HR 94 | Wt 170.7 lb

## 2020-01-11 DIAGNOSIS — Z3A16 16 weeks gestation of pregnancy: Secondary | ICD-10-CM

## 2020-01-11 DIAGNOSIS — Z603 Acculturation difficulty: Secondary | ICD-10-CM

## 2020-01-11 DIAGNOSIS — O09299 Supervision of pregnancy with other poor reproductive or obstetric history, unspecified trimester: Secondary | ICD-10-CM

## 2020-01-11 DIAGNOSIS — O0992 Supervision of high risk pregnancy, unspecified, second trimester: Secondary | ICD-10-CM

## 2020-01-11 DIAGNOSIS — O99891 Other specified diseases and conditions complicating pregnancy: Secondary | ICD-10-CM

## 2020-01-11 DIAGNOSIS — O0990 Supervision of high risk pregnancy, unspecified, unspecified trimester: Secondary | ICD-10-CM

## 2020-01-11 DIAGNOSIS — Z148 Genetic carrier of other disease: Secondary | ICD-10-CM

## 2020-01-11 DIAGNOSIS — Z98891 History of uterine scar from previous surgery: Secondary | ICD-10-CM

## 2020-01-11 DIAGNOSIS — Z789 Other specified health status: Secondary | ICD-10-CM

## 2020-01-11 MED ORDER — PREPLUS 27-1 MG PO TABS: 1.0000 | ORAL_TABLET | Freq: Every day | ORAL | 13 refills | Status: AC

## 2020-01-11 NOTE — Progress Notes (Signed)
   PRENATAL VISIT NOTE  Subjective:  Kathryn Rollins is a 29 y.o. G3P1011 at [redacted]w[redacted]d being seen today for ongoing prenatal care.  She is currently monitored for the following issues for this high-risk pregnancy and has Supervision of high risk pregnancy, antepartum; History of pre-eclampsia in prior pregnancy, currently pregnant; Language barrier; Elevated blood lead level; and History of cesarean section on their problem list.  Patient reports no complaints.  Contractions: Not present. Vag. Bleeding: None.   . Denies leaking of fluid.   The following portions of the patient's history were reviewed and updated as appropriate: allergies, current medications, past family history, past medical history, past social history, past surgical history and problem list.   Objective:   Vitals:   01/11/20 0913  BP: 112/72  Pulse: 94  Weight: 170 lb 11.2 oz (77.4 kg)    Fetal Status: Fetal Heart Rate (bpm): 148         General:  Alert, oriented and cooperative. Patient is in no acute distress.  Skin: Skin is warm and dry. No rash noted.   Cardiovascular: Normal heart rate noted  Respiratory: Normal respiratory effort, no problems with respiration noted  Abdomen: Soft, gravid, appropriate for gestational age.  Pain/Pressure: Absent     Pelvic: Cervical exam deferred        Extremities: Normal range of motion.  Edema: None  Mental Status: Normal mood and affect. Normal behavior. Normal judgment and thought content.   Assessment and Plan:  Pregnancy: G3P1011 at [redacted]w[redacted]d 1. Supervision of high risk pregnancy, antepartum Patient is doing well without complaints Patient declined AFP today  2. History of cesarean section Patient is considering TOLAC  3. History of pre-eclampsia in prior pregnancy, currently pregnant Continue ASA  4. Language barrier [place] interpreter present  Preterm labor symptoms and general obstetric precautions including but not limited to vaginal bleeding, contractions,  leaking of fluid and fetal movement were reviewed in detail with the patient. Please refer to After Visit Summary for other counseling recommendations.   No follow-ups on file.  Future Appointments  Date Time Provider Department Center  02/01/2020  8:15 AM [facility] NURSE [facility]  02/01/2020  8:15 AM [facility] US2 [facility]    Catalina Antigua, MD

## 2020-01-11 NOTE — Telephone Encounter (Signed)
Called patient with assistance of Emergency planning/management officer # [phone]. Patients husband also joined call at her request.   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   Reviewed with patient that it is recommended that you call [facility] at [phone] to schedule a Genetic Counseling Session to discuss implications for the baby.   Advised that Avelina Laine will most likely recommend that FOB also be screened for the same genes   Patient's questions answered. Father reports he is not concerned as he does not have any abnormal genes and he has no concerns about any of this. Mother was asked if she had questions and she said she has no further questions.

## 2020-01-11 NOTE — Progress Notes (Signed)
Language Interpreter Jocelyne U.

## 2020-01-11 NOTE — Addendum Note (Signed)
Addended by: Faythe Casa on: 01/11/2020 09:35 AM   Modules accepted: Orders

## 2020-01-11 NOTE — Patient Instructions (Signed)
Preparing for Vaginal Birth After Cesarean Delivery Vaginal birth after cesarean delivery (VBAC) is giving birth vaginally after previously delivering a baby through a cesarean section (C-section). You and your health are provider will discuss your options and whether you may be a good candidate for VBAC. What are my options? After a cesarean delivery, your options for future deliveries may include:  Scheduled repeat cesarean delivery. This is done in a hospital with an operating room.  Trial of labor after cesarean (TOLAC). A successful TOLAC results in a vaginal delivery. If it is not successful, you will need to have a cesarean delivery. TOLAC should be attempted in facilities where an emergency cesarean delivery can be performed. It should not be done as a home birth. Talk with your health care provider about the risks and benefits of each option early in your pregnancy. The best option for you will depend on your preferences and your overall health as well as your baby's. What should I know about my past cesarean delivery? It is important to know what type of incision was made in your uterus in a past cesarean delivery. The type of incision can affect the success of your TOLAC. Types of incisions include:  Low transverse. This is a side-to-side cut low on your uterus. The scar on your skin looks like a horizontal line just above your pubic area. This type of cut is the most common and makes you a good candidate for TOLAC.  Low vertical. This is an up-and-down cut low on your uterus. The scar on your skin looks like a vertical line between your pubic area and belly button. This type of cut puts you at higher risk for problems during TOLAC.  High vertical or classical. This is an up-and-down cut high on your uterus. The scar on your skin looks like a vertical line that runs over the top of your belly button. This type of cut has the highest risk for problems and usually means that TOLAC is not an  option. When is VBAC not an option? As you progress through your pregnancy, circumstances may change and you may need to reconsider your options. Your situation may also change even as you begin TOLAC. Your health care provider may not want you to attempt a VBAC if you:  Need to have labor started (induced) because your cervix is not ready for labor.  Have never had a vaginal delivery.  Have had more than two cesarean deliveries.  Are overdue.  Are pregnant with a very large baby.  Have a condition that causes high blood pressure (preeclampsia). Questions to ask your health care provider  Am I a good candidate for TOLAC?  What are my chances of a successful vaginal delivery?  Is my preferred birth location equipped for a TOLAC?  What are my pain management options during a TOLAC? Where to find more information  American Congress of Obstetricians and Gynecologists: www.acog.org  American College of Nurse-Midwives: www.midwife.org Summary  Vaginal birth after cesarean delivery (VBAC) is giving birth vaginally after previously delivering a baby through a cesarean section (C-section).  VBAC may be a safe and appropriate option for you depending on your medical history and other risk factors. Talk with your health care provider about the options available to you, and the risks and benefits of each early in your pregnancy.  TOLAC should be attempted in facilities where emergency cesarean section procedures can be performed. This information is not intended to replace advice given to you by   your health care provider. Make sure you discuss any questions you have with your health care provider. Document Revised: 10/27/2018 Document Reviewed: 10/10/2016 Elsevier Patient Education  2020 [employer]  

## 2020-01-13 ENCOUNTER — Encounter: Payer: Self-pay | Admitting: General Practice

## 2020-01-14 DIAGNOSIS — Z148 Genetic carrier of other disease: Secondary | ICD-10-CM | POA: Insufficient documentation

## 2020-01-14 DIAGNOSIS — D563 Thalassemia minor: Secondary | ICD-10-CM | POA: Insufficient documentation

## 2020-02-01 ENCOUNTER — Ambulatory Visit: Payer: Medicaid Other

## 2020-02-01 ENCOUNTER — Other Ambulatory Visit: Payer: Self-pay | Admitting: Family Medicine

## 2020-02-01 DIAGNOSIS — O0990 Supervision of high risk pregnancy, unspecified, unspecified trimester: Secondary | ICD-10-CM

## 2020-02-01 DIAGNOSIS — O09299 Supervision of pregnancy with other poor reproductive or obstetric history, unspecified trimester: Secondary | ICD-10-CM

## 2020-02-01 DIAGNOSIS — Z789 Other specified health status: Secondary | ICD-10-CM

## 2020-02-07 ENCOUNTER — Other Ambulatory Visit: Payer: Self-pay

## 2020-02-07 ENCOUNTER — Encounter: Payer: Self-pay | Admitting: *Deleted

## 2020-02-07 ENCOUNTER — Ambulatory Visit: Payer: Medicaid Other | Admitting: *Deleted

## 2020-02-07 ENCOUNTER — Ambulatory Visit: Payer: Medicaid Other | Attending: Obstetrics and Gynecology

## 2020-02-07 DIAGNOSIS — O09299 Supervision of pregnancy with other poor reproductive or obstetric history, unspecified trimester: Secondary | ICD-10-CM

## 2020-02-07 DIAGNOSIS — R7871 Abnormal lead level in blood: Secondary | ICD-10-CM | POA: Insufficient documentation

## 2020-02-07 DIAGNOSIS — O0990 Supervision of high risk pregnancy, unspecified, unspecified trimester: Secondary | ICD-10-CM

## 2020-02-07 DIAGNOSIS — Z789 Other specified health status: Secondary | ICD-10-CM

## 2020-02-07 DIAGNOSIS — Z363 Encounter for antenatal screening for malformations: Secondary | ICD-10-CM

## 2020-02-07 DIAGNOSIS — Z3A20 20 weeks gestation of pregnancy: Secondary | ICD-10-CM

## 2020-02-07 DIAGNOSIS — Z148 Genetic carrier of other disease: Secondary | ICD-10-CM

## 2020-02-08 ENCOUNTER — Other Ambulatory Visit: Payer: Self-pay | Admitting: *Deleted

## 2020-02-09 ENCOUNTER — Encounter: Payer: Medicaid Other | Admitting: Obstetrics and Gynecology

## 2020-02-16 ENCOUNTER — Encounter: Payer: Self-pay | Admitting: Obstetrics & Gynecology

## 2020-02-16 ENCOUNTER — Telehealth (INDEPENDENT_AMBULATORY_CARE_PROVIDER_SITE_OTHER): Payer: Self-pay | Admitting: Obstetrics & Gynecology

## 2020-02-16 DIAGNOSIS — U071 COVID-19: Secondary | ICD-10-CM

## 2020-02-16 DIAGNOSIS — O98512 Other viral diseases complicating pregnancy, second trimester: Secondary | ICD-10-CM

## 2020-02-16 DIAGNOSIS — O0990 Supervision of high risk pregnancy, unspecified, unspecified trimester: Secondary | ICD-10-CM

## 2020-02-16 DIAGNOSIS — Z3A21 21 weeks gestation of pregnancy: Secondary | ICD-10-CM

## 2020-02-16 HISTORY — DX: COVID-19: U07.1

## 2020-02-16 HISTORY — DX: Other viral diseases complicating pregnancy, second trimester: O98.512

## 2020-02-16 NOTE — Patient Instructions (Signed)
Return to office for any scheduled appointments. Call the office or go to the MAU at [facility] at [facility] if:  You begin to have strong, frequent contractions  Your water breaks.  Sometimes it is a big gush of fluid, sometimes it is just a trickle that keeps getting your panties wet or running down your legs  You have vaginal bleeding.  It is normal to have a small amount of spotting if your cervix was checked.   You do not feel your baby moving like normal.  If you do not, get something to eat and drink and lay down and focus on feeling your baby move.   If your baby is still not moving like normal, you should call the office or go to MAU.  Any other obstetric concerns.   Pregnancy and COVID-19 Coronavirus disease, also called COVID-19, is an infection of the lungs and airways (respiratory tract). It is unclear at this time if pregnancy makes it more likely for you to get COVID-19, or what effects the infection may have on your unborn baby. However, pregnancy causes changes to your heart, lungs, and your body's disease-fighting system (immune system). Some of these changes make it more likely for you to get sick and have more serious illness. Therefore, it is important for you to take precautions in order to protect yourself and your unborn baby. There have been studies showing that obesity and diabetes may put you at higher risk for serious illness. If you are pregnant and are obese or have diabetes, you should take extra precautions to protect yourself from the virus. Work with your health care team to develop a plan to protect yourself from all infections, including COVID-19. This is one way for you to stay healthy during your pregnancy and to keep your baby healthy as well. How does this affect me? If you get COVID-19, there is a risk that you may:  Get a respiratory illness that can lead to pneumonia.  Give birth to your baby before 37 weeks of pregnancy (premature  birth). If you have or may have COVID-19, your health care provider may recommend special precautions around your pregnancy. This may affect how you:  Receive care before delivery (prenatal care). How you visit your health care provider may change. Tests and scans may need to be performed differently.  Receive care during labor and delivery. This may affect your birth plan, including who may be with you during labor and delivery.  Receive care after you deliver your baby (postpartum care). You may stay longer in the hospital and in a special room.  Feed your baby after he or she is born. Pregnancy can be an especially stressful time because of the changes in your body and the preparation involved in becoming a parent. In addition, you may be feeling especially fearful, anxious, or stressed because of COVID-19 and how it is affecting you. How does this affect my baby? It is not known whether a mother will transmit the virus to her unborn baby. There is a risk that if you get COVID-19:  The virus that causes COVID-19 can pass to your baby.  You may have premature birth. Your baby may require more medical care if this happens. What can I do to lower my risk?  There is no vaccine to help prevent COVID-19. However, there are actions that you can take to protect yourself and others from this virus. Cleaning and personal hygiene  Wash your hands often  with soap and water for at least 20 seconds. If soap and water are not available, use alcohol-based hand sanitizer.  Avoid touching your mouth, face, eyes, or nose.  Clean and disinfect objects and surfaces that are frequently touched every day. These may include: ? Counters and tables. ? Doorknobs and light switches. ? Sinks and faucets. ? Electronics such as phones, remote controls, keyboards, computers, and tablets. Stay away from others  Stay away from people who are sick, if possible.  Avoid social gatherings and travel.  Stay home as  much as possible. Follow these instructions: Breastfeeding It is not known if the virus that causes COVID-19 can pass through breast milk to your baby. You should make a plan for feeding your infant with your family and your health care team. If you have or may have COVID-19, your health care provider may recommend that you take precautions while breastfeeding, such as:  Washing your hands before feeding your baby.  Wearing a mask while feeding your baby.  Pumping or expressing breast milk to feed to your baby. If possible, ask someone in your household who is not sick to feed your baby the expressed breast milk. ? Wash your hands before touching pump parts. ? Wash and disinfect all pump parts after expressing milk. Follow the manufacturer's instructions to clean and disinfect all pump parts. General instructions  If you think you have a COVID-19 infection, contact your health care provider right away. Tell your health care provider that you think you may have a COVID-19 infection.  Follow your health care provider's instructions on taking medicines. Some medicines may be unsafe to take during pregnancy.  Cover your mouth and nose by wearing a mask or other cloth covering over your face when you go out in public.  Find ways to manage stress. These may include: ? Using relaxation techniques like meditation and deep breathing. ? Getting regular exercise. Most women can continue their usual exercise routine during pregnancy. Ask your health care provider what activities are safe for you. ? Seeking support from family, friends, or spiritual resources. If you cannot be together in person, you can still connect by phone calls, texts, video calls, or online messaging. ? Spending time doing relaxing activities that you enjoy, like listening to music or reading a good book.  Ask for help if you have counseling or nutritional needs during pregnancy. Your health care provider can offer advice or  refer you to resources or specialists who can help you with various needs.  Keep all follow-up visits as told by your health care provider. This is important. Where to find more information Centers for Disease Control and Prevention (CDC): AffordableShare.com.br World Health Organization [facility]): PokerPortraits.es [employer]ians [employer]necologists (ACOG): BuyDucts.dk Questions to ask your health care team  What should I do if I have COVID-19 symptoms?  How will COVID-19 affect my prenatal care visits, tests and scans, labor and delivery, and postpartum care?  Should I plan to breastfeed my baby?  Where can I find mental health resources?  Where can I find support if I have financial concerns? Contact a health care provider if:  You have signs and symptoms of infection, including a fever or cough. Tell your health care team that you think you may have a COVID-19 infection.  You have strong emotions, such as sadness or anxiety.  You feel unsafe in your home and need help finding a safe place to live.  You have bloody or watery vaginal  discharge or vaginal bleeding. Get help right away if:  You have signs or symptoms of labor before 37 weeks of pregnancy. These include: ? Contractions that are 5 minutes or less apart, or that increase in frequency, intensity, or length. ? Sudden, sharp pain in the abdomen or in the lower back. ? A gush or trickle of fluid from your vagina.  You have signs of more serious illness such as: ? You have difficulty breathing. ? You have chest pain. ? You have a fever greater than 102F (39C) or higher that does not go away. ? You cannot drink fluids without vomiting. ? You feel extremely weak or you faint. These symptoms may represent a serious problem that is an emergency. Do not  wait to see if the symptoms will go away. Get medical help right away. Call your local emergency services (911 in the [place] Do not drive yourself to the hospital. Summary  Coronavirus disease, also called COVID-19, is an infection of the lungs and airways (respiratory tract). It is unclear at this time if pregnancy makes you more susceptible to COVID-19 and what effects it may have on unborn babies.  It is important to take precautions to protect yourself and your developing baby. This includes washing your hands often, avoiding touching your mouth, face, eyes, or nose, avoiding social gatherings and travel, and staying away from people who are sick.  If you think you have a COVID-19 infection, contact your health care provider right away. Tell your health care provider that you think you may have a COVID-19 infection.  If you have or may have COVID-19, your health care provider may recommend special precautions during your pregnancy, labor and delivery, and after your baby is born. This information is not intended to replace advice given to you by your health care provider. Make sure you discuss any questions you have with your health care provider. Document Revised: 04/23/2019 Document Reviewed: 10/27/2018 Elsevier Patient Education  2020 [employer].

## 2020-02-16 NOTE — Progress Notes (Signed)
OBSTETRICS PRENATAL VIRTUAL VISIT ENCOUNTER NOTE  Provider location: [facility]   I connected with Tayden Goerke on 02/16/20 at  1:15 PM EDT by MyChart Video Encounter at home and verified that I am speaking with the correct person using two identifiers.   I discussed the limitations, risks, security and privacy concerns of performing an evaluation and management service virtually and the availability of in person appointments. I also discussed with the patient that there may be a patient responsible charge related to this service. The patient expressed understanding and agreed to proceed. Subjective:  Kathryn Rollins is a 29 y.o. G3P1011 at [redacted]w[redacted]d being seen today for ongoing prenatal care. Patient is French-speaking only, interpreter present for this encounter.  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  Patient reports that she had fever and cough 02/07/20 and tested positive for COVID 19 on 02/10/20.  Doing well as of 02/16/20.   Contractions: Not present. Vag. Bleeding: None.  Movement: Present. Denies any leaking of fluid.   The following portions of the patient's history were reviewed and updated as appropriate: allergies, current medications, past family history, past medical history, past social history, past surgical history and problem list.   Objective:  There were no vitals filed for this visit.  Fetal Status:     Movement: Present     General:  Alert, oriented and cooperative. Patient is in no acute distress.  Respiratory: Normal respiratory effort, no  problems with respiration noted  Mental Status: Normal mood and affect. Normal behavior. Normal judgment and thought content.  Rest of physical exam deferred due to type of encounter  Imaging: [place] MFM OB DETAIL +14 WK  Result Date: 02/07/2020 ----------------------------------------------------------------------  OBSTETRICS REPORT                       (Signed Final 02/07/2020 06:53 pm) ---------------------------------------------------------------------- Patient Info  ID #:       [id number]                          D.O.B.:  Dec 26, 1990 (29 yrs)  Name:       Kathryn Rollins                 Visit Date: 02/07/2020 02:22 pm ---------------------------------------------------------------------- Performed By  Attending:        Noralee Space MD        Ref. Address:     [address]  [place], [place]                                                             [address]  Performed By:     Truitt Leep       [facility]  Referred By:      Joselyn Arrow                    MD ---------------------------------------------------------------------- Orders  #  Description                           Code        Ordered By  1  [place] MFM OB DETAIL +14 WK               76811.01    CHELSEA FAIR ----------------------------------------------------------------------  #  Order #                     Accession #                Episode #  1  [id number]                   [id number]                 [id number] ---------------------------------------------------------------------- Indications  Genetic carrier (specify) Rhea Belton    Z14.8  Encounter for antenatal screening  for          Z36.3  malformations  Genetic carrier (specify) Alpha Thalassemia    Z14.8  Hx C/S  [redacted] weeks gestation of pregnancy                Z3A.20 ---------------------------------------------------------------------- Fetal Evaluation  Num Of Fetuses:         1  Cardiac Activity:       Observed  Presentation:           Variable  Placenta:               Posterior Fundal  P. Cord Insertion:      Visualized, central  Amniotic  Fluid  AFI FV:      Within normal limits ---------------------------------------------------------------------- Biometry  BPD:      50.4  mm     G. Age:  21w 2d         88  %    CI:        77.45   %    70 - 86                                                          FL/HC:      18.5   %    16.8 - 19.8  HC:      181.3  mm     G. Age:  20w 4d         61  %    HC/AC:      1.08        1.09 - 1.39  AC:      167.7  mm     G. Age:  21w 5d         90  %    FL/BPD:     66.7   %  FL:       33.6  mm     G. Age:  20w 4d         56  %    FL/AC:      20.0   %    20 - 24  HUM:      32.7  mm     G. Age:  21w 0d         76  %  CER:      21.8  mm     G. Age:  20w 4d         84  %  NFT:       4.1  mm  CM:        6.1  mm  Est. FW:     403  gm    0 lb 14 oz      93  % ---------------------------------------------------------------------- OB History  Gravidity:    3         Term:   1         SAB:   1 ---------------------------------------------------------------------- Gestational Age  LMP:           20w 1d        Date:  09/19/19                 EDD:   06/25/20  U/S Today:     21w 0d                                        EDD:   06/19/20  Best:          20w 1d     Det. By:  LMP  (09/19/19)          EDD:   06/25/20 ---------------------------------------------------------------------- Anatomy  Cranium:               Appears normal         LVOT:  Appears normal  Cavum:                 Appears normal         Aortic Arch:            Appears normal  Ventricles:            Appears normal         Ductal  Arch:            Appears normal  Choroid Plexus:        Appears normal         Diaphragm:              Appears normal  Cerebellum:            Appears normal         Stomach:                Appears normal, left                                                                        sided  Posterior Fossa:       Appears normal         Abdomen:                Appears normal  Nuchal Fold:           Appears normal         Abdominal Wall:         Appears nml (cord                                                                        insert, abd wall)  Face:                  Appears normal         Cord Vessels:           Appears normal (3                         (orbits and profile)                           vessel cord)  Lips:                  Appears normal         Kidneys:                Appear normal  Palate:                Appears normal         Bladder:                Appears normal  Thoracic:              Appears normal         Spine:  Appears normal  Heart:                 Appears normal         Upper Extremities:      Appears normal                         (4CH, axis, and                         situs)  RVOT:                  Appears normal         Lower Extremities:      Appears normal  Other:  Single live IUP. Nasal bone visualized. 3VV and 3VTV visualized.          Heels/feet and open hands/5th digits visualized. ---------------------------------------------------------------------- Cervix Uterus Adnexa  Cervix  Length:              3  cm.  Normal appearance by transabdominal scan.  Right Ovary  Within normal limits.  Left Ovary  Within normal limits. ---------------------------------------------------------------------- Impression  G3 P1.  Patient is here for fetal anatomy scan.  Obstetric  history is significant for a term cesarean delivery.  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  We performed fetal anatomy scan. No makers of  aneuploidies or fetal structural defects are seen. Fetal  biometry is consistent with her previously-established dates.  Amniotic fluid is normal and good fetal activity is seen.  Placenta is posterior and there is no evidence of previa or  accreta.  Patient understands the limitations of ultrasound in  detecting fetal anomalies.  I explained the significance of carrier status with the help of  friends language interpreter present in the room.  I discussed  partner screening and also the availability of amniocentesis  for prenatal diagnosis.  I recommended that she meet with  our genetic counselor for detailed counseling. ---------------------------------------------------------------------- Recommendations  -An appointment was made for her to return for genetic  counseling. ----------------------------------------------------------------------                  Noralee Spaceavi Shankar, MD Electronically Signed Final Report   02/07/2020 06:53 pm ----------------------------------------------------------------------   Assessment and Plan:  Pregnancy: G3P1011 at 6511w3d 1. COVID-19 affecting pregnancy in second trimester Unvaccinated. Had fever and cough 02/07/20 and tested positive on 02/10/20.  Doing well as of 02/16/20.  MFM notified given that she was there on 02/07/20.  Advised to come in for worsening symptoms.   2. [redacted] weeks gestation of pregnancy 3. Supervision of high risk pregnancy, antepartum Normal anatomy scan.  No other concerns.  Preterm labor symptoms and general obstetric precautions including but not limited to vaginal bleeding, contractions, leaking of fluid and fetal movement were reviewed in detail with the patient. I discussed the assessment and treatment plan with the patient. The patient was provided an opportunity to ask questions and all were answered. The patient agreed with the plan and demonstrated an understanding of the  instructions. The patient was advised to call back or seek an in-person office evaluation/[facility] for any urgent or concerning symptoms. Please refer to After Visit Summary for other counseling recommendations.   I provided 10  minutes of face-to-face time  during this encounter.  Return in about 2 weeks (around 03/01/2020) for OFFICE OB Visit.  Future Appointments  Date Time Provider Department Center  02/21/2020  1:00 PM [facility] GENETIC COUNSELING RM [facility]    Jaynie Collins, MD Center for [facility], [facility]

## 2020-02-16 NOTE — Progress Notes (Signed)
I connected with  Kathryn Rollins on 02/16/20 at  1:15 PM EDT by telephone and verified that I am speaking with the correct person using two identifiers.   I discussed the limitations, risks, security and privacy concerns of performing an evaluation and management service by telephone and the availability of in person appointments. I also discussed with the patient that there may be a patient responsible charge related to this service. The patient expressed understanding and agreed to proceed.  Henrietta Dine, CMA 02/16/2020  1:45 PM

## 2020-02-21 ENCOUNTER — Ambulatory Visit: Payer: Medicaid Other | Attending: Obstetrics and Gynecology | Admitting: Genetic Counselor

## 2020-02-21 ENCOUNTER — Other Ambulatory Visit: Payer: Self-pay

## 2020-02-21 ENCOUNTER — Ambulatory Visit: Payer: Self-pay | Admitting: Genetic Counselor

## 2020-02-21 DIAGNOSIS — Z148 Genetic carrier of other disease: Secondary | ICD-10-CM

## 2020-02-21 DIAGNOSIS — D563 Thalassemia minor: Secondary | ICD-10-CM

## 2020-02-21 DIAGNOSIS — Z315 Encounter for genetic counseling: Secondary | ICD-10-CM

## 2020-02-21 NOTE — Progress Notes (Signed)
02/21/2020  Efrat Bushey 1990/08/14 MRN: [id number] DOV: 02/21/2020  Ms. 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 Ms. Cadle was presented to her appointment with her partner and their 29 month old daughter. However, due to our visitation policy's child restrictions, Ms. Cordoba partner waited with their daughter outside. This session was facilitated by a [facility] interpreter.  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 Smoots is a G3P1011, 29 y.o. female. Her current pregnancy has completed [redacted]w[redacted]d (Estimated Date of Delivery: 06/25/20). The couple has a healthy 47 month old daughter. Ms. Satterwhite has also had one prior miscarriage, reportedly due to a medication she took before learning of the pregnancy.  Ms. Tsukamoto denied exposure to environmental toxins or chemical agents. She denied the use of alcohol, tobacco or street drugs. She reported taking prenatal vitamins and aspirin. She denied significant viral illnesses, fevers, and bleeding during the course of her pregnancy. Per records, she had a history of preeclampsia during her pregnancy with her daughter who was born via Cesarean section at 68 weeks' gestation. Her medical and surgical histories were otherwise noncontributory.  Family History  A three generation pedigree was drafted and reviewed. The family history is remarkable for the following:  - Ms. Cundy's uncle has a son who is unable to walk as "his legs don't work". He can sit up, speaks, and does not have any other medical problems per Ms. Grether. Without further information about this individual, risk assessment is limited.  The remaining family histories were reviewed and found to be noncontributory for birth defects,  intellectual disability, recurrent pregnancy loss, and known genetic conditions.    The patient's ethnicity is [place]. The father of the pregnancy's ethnicity is [place]. Ashkenazi Jewish ancestry and consanguinity were denied. Pedigree will be scanned under Media.  Discussion  Carrier screening results:  Ms. 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 We very briefly reviewed pertinent features of each condition.  We discussed that alpha-thalassemia is a blood disorder that can cause anemia, enlargement of the spleen, growth retardation, and occasional bone changes. I informed Ms. Crotteau that she only has a chance of having a child affected by alpha-thalassemia if her partner is also a carrier (specifically a carrier in cis, denoted as aa/--, as she is a silent carrier, denoted as aa/a-). If he was a carrier, the couple's chance of having a child with alpha-thalassemia would be 1 in 4 (25%). Ms. Minish partner has a 1 in 30 chance of being any type of carrier for alpha-thalassemia; however, individuals with African ancestry are rarely carriers in cis. Thus, Ms. Lippman was counseled that the couple's chances of having a child with alpha-thalassemia is less than 1 in 120 (<1%).   We discussed that Smith-Lemli-Opitz syndrome (SLOS) is a condition that affects how the body produces cholesterol. Individuals with this condition cannot produce enough cholesterol. As a result, affected individuals can have developmental concerns and medical problems that can affect many different parts of the body. Ms. Conaty was counseled that she and her partner only have a chance of having a child with SLOS if they are both carriers for the condition. If they are both carriers, the couple would have a 1 in 4 (25%)  chance of having a child with SLOS. The pan-ethnic  carrier frequency for SLOS is 1 in 70. Based on the fact that the couple has a healthy daughter together, their chance of having a child with SLOS is reduced to (1/93)(1/2)(1/2) = 1/372 (0.3%).   Ms. Gutierrez carrier screening was negative for the other 12 conditions screened. Thus, her risk to be a carrier for these additional conditions (listed separately in the laboratory report) has been reduced but not eliminated. This also significantly reduces her risk of having a child affected by one of these conditions. We discussed that carrier testing for alpha-thalassemia and SLOS is recommended for Ms. Reh's partner. Ms. Vigil indicated that she is not interested in pursuing partner carrier screening.   Ultrasound:  A complete ultrasound was performed on 02/07/20. We reviewed that there were no visualized fetal anomalies or markers suggestive of aneuploidy on this ultrasound. Ms. Constable understands that ultrasound cannot rule out all possible birth defects or genetic syndromes.  Additional testing:  Ms. Hunsberger previously had [facility] noninvasive prenatal screening (NIPS) that was low risk for fetal aneuploidies. These results were not reviewed in detail today. We also did not discuss the option of MS-AFP screening or amniocentesis today, as Ms. Jennette declined MS-AFP screening when it was offered by her OBGYN provider and made it clear that she did not wish to pursue any other additional genetic testing during this pregnancy.   Plan:  Ms. Pfefferkorn was upset during today's appointment. She said that she was not aware of what the purpose of carrier screening was before undergoing testing and remarked that if she had known, she would have elected not to have carrier screening. This result has put a lot of unwanted stress on her and her partner. She informed me that she would be continuing the pregnancy either way, so pursuing additional testing would not offer much benefit to her in the  prenatal period and would cause her more anxiety. Additionally, this will likely be the last child for her and her partner, so partner carrier screening would not confer any benefits for future pregnancies. Ms. Amara felt comfortable with the risk figures that were presented today for having a child with alpha-thalassemia or SLOS. She said that she is relying on her faith and praying that her baby will be born healthy. For these reasons, she opted not to pursue partner carrier screening. I reassured her that postnatal testing is always possible if clinically indicated.  I counseled Ms. Liddy regarding the above risks and available options. The approximate face-to-face time with the genetic counselor was 45 minutes.  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 She does not wish to pursue any additional genetic testing during this pregnancy and will pursue postnatal testing if clinically indicated  Reviewed results of ultrasound  No fetal anomalies or markers seen  Reduction in risk for fetal aneuploidy  Reviewed family history concerns   Gershon Crane, MS, Aeronautical engineer

## 2020-03-03 ENCOUNTER — Encounter: Payer: Self-pay | Admitting: General Practice

## 2020-03-09 ENCOUNTER — Other Ambulatory Visit: Payer: Self-pay

## 2020-03-09 ENCOUNTER — Ambulatory Visit (INDEPENDENT_AMBULATORY_CARE_PROVIDER_SITE_OTHER): Payer: Medicaid Other | Admitting: Obstetrics and Gynecology

## 2020-03-09 ENCOUNTER — Encounter: Payer: Self-pay | Admitting: Obstetrics and Gynecology

## 2020-03-09 VITALS — BP 114/76 | HR 98 | Wt 173.1 lb

## 2020-03-09 DIAGNOSIS — Z148 Genetic carrier of other disease: Secondary | ICD-10-CM

## 2020-03-09 DIAGNOSIS — O09299 Supervision of pregnancy with other poor reproductive or obstetric history, unspecified trimester: Secondary | ICD-10-CM

## 2020-03-09 DIAGNOSIS — Z98891 History of uterine scar from previous surgery: Secondary | ICD-10-CM

## 2020-03-09 DIAGNOSIS — Z3A24 24 weeks gestation of pregnancy: Secondary | ICD-10-CM

## 2020-03-09 DIAGNOSIS — O0990 Supervision of high risk pregnancy, unspecified, unspecified trimester: Secondary | ICD-10-CM

## 2020-03-09 DIAGNOSIS — O99012 Anemia complicating pregnancy, second trimester: Secondary | ICD-10-CM

## 2020-03-09 DIAGNOSIS — D563 Thalassemia minor: Secondary | ICD-10-CM

## 2020-03-09 LAB — POCT URINALYSIS DIP (DEVICE)
Bilirubin Urine: NEGATIVE
Glucose, UA: NEGATIVE mg/dL
Hgb urine dipstick: NEGATIVE
Ketones, ur: NEGATIVE mg/dL
Leukocytes,Ua: NEGATIVE
Nitrite: NEGATIVE
Protein, ur: NEGATIVE mg/dL
Specific Gravity, Urine: 1.02 (ref 1.005–1.030)
Urobilinogen, UA: 0.2 mg/dL (ref 0.0–1.0)
pH: 6.5 (ref 5.0–8.0)

## 2020-03-09 MED ORDER — BLOOD PRESSURE KIT DEVI: 1.0000 | 0 refills | Status: AC | PRN

## 2020-03-09 NOTE — Progress Notes (Signed)
Language Resources interpreter

## 2020-03-09 NOTE — Addendum Note (Signed)
Addended by: Faythe Casa on: 03/09/2020 10:08 AM   Modules accepted: Orders

## 2020-03-09 NOTE — Addendum Note (Signed)
Addended by: Hermina Staggers on: 03/09/2020 01:09 PM   Modules accepted: Orders

## 2020-03-09 NOTE — Progress Notes (Signed)
Subjective:  Kathryn Rollins is a 29 y.o. G3P1011 at [redacted]w[redacted]d being seen today for ongoing prenatal care.  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  Patient reports no complaints.  Contractions: Not present. Vag. Bleeding: None.  Movement: Present. Denies leaking of fluid.   The following portions of the patient's history were reviewed and updated as appropriate: allergies, current medications, past family history, past medical history, past social history, past surgical history and problem list. Problem list updated.  Objective:   Vitals:   03/09/20 0926  BP: 114/76  Pulse: 98  Weight: 78.5 kg    Fetal Status: Fetal Heart Rate (bpm): 154   Movement: Present     General:  Alert, oriented and cooperative. Patient is in no acute distress.  Skin: Skin is warm and dry. No rash noted.   Cardiovascular: Normal heart rate noted  Respiratory: Normal respiratory effort, no problems with respiration noted  Abdomen: Soft, gravid, appropriate for gestational age. Pain/Pressure: Present     Pelvic:  Cervical exam deferred        Extremities: Normal range of motion.  Edema: None  Mental Status: Normal mood and affect. Normal behavior. Normal judgment and thought content.   Urinalysis:      Assessment and Plan:  Pregnancy: G3P1011 at [redacted]w[redacted]d  1. Supervision of high risk pregnancy, antepartum Stable Glucola next visit  2. History of pre-eclampsia in prior pregnancy, currently pregnant BP stable Continue with qd BASA  3. History of cesarean section Desires TOLAC, Discuss at next OB appt and sign TOLAC consent  4. Genetic carrier - Smith-Lemli-Opitz  Syndrome Referred for genetic counseling  5. Alpha thalassemia silent carrier Partner declined testing  Preterm labor symptoms and general obstetric precautions including but not limited to vaginal bleeding, contractions, leaking of fluid and fetal movement were reviewed in detail with the patient. Please refer to After Visit Summary for other counseling recommendations.  Return in about 4 weeks (around 04/06/2020) for OB visit, face to face, MD only, fasting appt for Glucola.   Hermina Staggers, MD

## 2020-03-09 NOTE — Patient Instructions (Addendum)
[facility]- [address] [facility], [place], [place]    Second Trimester of Pregnancy  The second trimester is from week 14 through week 27 (month 4 through 6). This is often the time in pregnancy that you feel your best. Often times, morning sickness has lessened or quit. You may have more energy, and you may get hungry more often. Your unborn baby is growing rapidly. At the end of the sixth month, he or she is about 9 inches long and weighs about 1 pounds. You will likely feel the baby move between 18 and 20 weeks of pregnancy. Follow these instructions at home: Medicines  Take over-the-counter and prescription medicines only as told by your doctor. Some medicines are safe and some medicines are not safe during pregnancy.  Take a prenatal vitamin that contains at least 600 micrograms (mcg) of folic acid.  If you have trouble pooping (constipation), take medicine that will make your stool soft (stool softener) if your doctor approves. Eating and drinking   Eat regular, healthy meals.  Avoid raw meat and uncooked cheese.  If you get low calcium from the food you eat, talk to your doctor about taking a daily calcium supplement.  Avoid foods that are high in fat and sugars, such as fried and sweet foods.  If you feel sick to your stomach (nauseous) or throw up (vomit): ? Eat 4 or 5 small meals a day instead of 3 large meals. ? Try eating a few soda crackers. ? Drink liquids between meals instead of during meals.  To prevent constipation: ? Eat foods that are high in fiber, like fresh fruits and vegetables, whole grains, and beans. ? Drink enough fluids to keep your pee (urine) clear or pale yellow. Activity  Exercise only as told by your doctor. Stop exercising if you start to have cramps.  Do not exercise if it is too hot, too humid, or if you are in a place of great height (high altitude).  Avoid heavy lifting.  Wear low-heeled shoes. Sit and stand up straight.  You can  continue to have sex unless your doctor tells you not to. Relieving pain and discomfort  Wear a good support bra if your breasts are tender.  Take warm water baths (sitz baths) to soothe pain or discomfort caused by hemorrhoids. Use hemorrhoid cream if your doctor approves.  Rest with your legs raised if you have leg cramps or low back pain.  If you develop puffy, bulging veins (varicose veins) in your legs: ? Wear support hose or compression stockings as told by your doctor. ? Raise (elevate) your feet for 15 minutes, 3-4 times a day. ? Limit salt in your food. Prenatal care  Write down your questions. Take them to your prenatal visits.  Keep all your prenatal visits as told by your doctor. This is important. Safety  Wear your seat belt when driving.  Make a list of emergency phone numbers, including numbers for family, friends, the hospital, and police and fire departments. General instructions  Ask your doctor about the right foods to eat or for help finding a counselor, if you need these services.  Ask your doctor about local prenatal classes. Begin classes before month 6 of your pregnancy.  Do not use hot tubs, steam rooms, or saunas.  Do not douche or use tampons or scented sanitary pads.  Do not cross your legs for long periods of time.  Visit your dentist if you have not done so. Use a soft toothbrush to  brush your teeth. Floss gently.  Avoid all smoking, herbs, and alcohol. Avoid drugs that are not approved by your doctor.  Do not use any products that contain nicotine or tobacco, such as cigarettes and e-cigarettes. If you need help quitting, ask your doctor.  Avoid cat litter boxes and soil used by cats. These carry germs that can cause birth defects in the baby and can cause a loss of your baby (miscarriage) or stillbirth. Contact a doctor if:  You have mild cramps or pressure in your lower belly.  You have pain when you pee (urinate).  You have bad  smelling fluid coming from your vagina.  You continue to feel sick to your stomach (nauseous), throw up (vomit), or have watery poop (diarrhea).  You have a nagging pain in your belly area.  You feel dizzy. Get help right away if:  You have a fever.  You are leaking fluid from your vagina.  You have spotting or bleeding from your vagina.  You have severe belly cramping or pain.  You lose or gain weight rapidly.  You have trouble catching your breath and have chest pain.  You notice sudden or extreme puffiness (swelling) of your face, hands, ankles, feet, or legs.  You have not felt the baby move in over an hour.  You have severe headaches that do not go away when you take medicine.  You have trouble seeing. Summary  The second trimester is from week 14 through week 27 (months 4 through 6). This is often the time in pregnancy that you feel your best.  To take care of yourself and your unborn baby, you will need to eat healthy meals, take medicines only if your doctor tells you to do so, and do activities that are safe for you and your baby.  Call your doctor if you get sick or if you notice anything unusual about your pregnancy. Also, call your doctor if you need help with the right food to eat, or if you want to know what activities are safe for you. This information is not intended to replace advice given to you by your health care provider. Make sure you discuss any questions you have with your health care provider. Document Revised: 10/23/2018 Document Reviewed: 08/06/2016 Elsevier Patient Education  2020 [employer].

## 2020-03-10 ENCOUNTER — Encounter: Payer: Medicaid Other | Admitting: Obstetrics and Gynecology

## 2020-04-03 ENCOUNTER — Other Ambulatory Visit: Payer: Self-pay | Admitting: General Practice

## 2020-04-03 DIAGNOSIS — O0990 Supervision of high risk pregnancy, unspecified, unspecified trimester: Secondary | ICD-10-CM

## 2020-04-05 ENCOUNTER — Other Ambulatory Visit: Payer: Self-pay

## 2020-04-05 ENCOUNTER — Encounter: Payer: Self-pay | Admitting: *Deleted

## 2020-04-05 ENCOUNTER — Ambulatory Visit (INDEPENDENT_AMBULATORY_CARE_PROVIDER_SITE_OTHER): Payer: Self-pay | Admitting: Obstetrics and Gynecology

## 2020-04-05 ENCOUNTER — Encounter: Payer: Self-pay | Admitting: Obstetrics and Gynecology

## 2020-04-05 ENCOUNTER — Other Ambulatory Visit: Payer: Medicaid Other

## 2020-04-05 VITALS — BP 114/78 | HR 102 | Wt 181.7 lb

## 2020-04-05 DIAGNOSIS — Z98891 History of uterine scar from previous surgery: Secondary | ICD-10-CM

## 2020-04-05 DIAGNOSIS — O99013 Anemia complicating pregnancy, third trimester: Secondary | ICD-10-CM

## 2020-04-05 DIAGNOSIS — D563 Thalassemia minor: Secondary | ICD-10-CM

## 2020-04-05 DIAGNOSIS — Z148 Genetic carrier of other disease: Secondary | ICD-10-CM

## 2020-04-05 DIAGNOSIS — O09293 Supervision of pregnancy with other poor reproductive or obstetric history, third trimester: Secondary | ICD-10-CM

## 2020-04-05 DIAGNOSIS — O0990 Supervision of high risk pregnancy, unspecified, unspecified trimester: Secondary | ICD-10-CM

## 2020-04-05 DIAGNOSIS — O09299 Supervision of pregnancy with other poor reproductive or obstetric history, unspecified trimester: Secondary | ICD-10-CM

## 2020-04-05 NOTE — Progress Notes (Signed)
rou

## 2020-04-05 NOTE — Patient Instructions (Signed)
Third Trimester of Pregnancy The third trimester is from week 28 through week 40 (months 7 through 9). The third trimester is a time when the unborn baby (fetus) is growing rapidly. At the end of the ninth month, the fetus is about 20 inches in length and weighs 6-10 pounds. Body changes during your third trimester Your body will continue to go through many changes during pregnancy. The changes vary from woman to woman. During the third trimester:  Your weight will continue to increase. You can expect to gain 25-35 pounds (11-16 kg) by the end of the pregnancy.  You may begin to get stretch marks on your hips, abdomen, and breasts.  You may urinate more often because the fetus is moving lower into your pelvis and pressing on your bladder.  You may develop or continue to have heartburn. This is caused by increased hormones that slow down muscles in the digestive tract.  You may develop or continue to have constipation because increased hormones slow digestion and cause the muscles that push waste through your intestines to relax.  You may develop hemorrhoids. These are swollen veins (varicose veins) in the rectum that can itch or be painful.  You may develop swollen, bulging veins (varicose veins) in your legs.  You may have increased body aches in the pelvis, back, or thighs. This is due to weight gain and increased hormones that are relaxing your joints.  You may have changes in your hair. These can include thickening of your hair, rapid growth, and changes in texture. Some women also have hair loss during or after pregnancy, or hair that feels dry or thin. Your hair will most likely return to normal after your baby is born.  Your breasts will continue to grow and they will continue to become tender. A yellow fluid (colostrum) may leak from your breasts. This is the first milk you are producing for your baby.  Your belly button may stick out.  You may notice more swelling in your hands,  face, or ankles.  You may have increased tingling or numbness in your hands, arms, and legs. The skin on your belly may also feel numb.  You may feel short of breath because of your expanding uterus.  You may have more problems sleeping. This can be caused by the size of your belly, increased need to urinate, and an increase in your body's metabolism.  You may notice the fetus "dropping," or moving lower in your abdomen (lightening).  You may have increased vaginal discharge.  You may notice your joints feel loose and you may have pain around your pelvic bone. What to expect at prenatal visits You will have prenatal exams every 2 weeks until week 36. Then you will have weekly prenatal exams. During a routine prenatal visit:  You will be weighed to make sure you and the baby are growing normally.  Your blood pressure will be taken.  Your abdomen will be measured to track your baby's growth.  The fetal heartbeat will be listened to.  Any test results from the previous visit will be discussed.  You may have a cervical check near your due date to see if your cervix has softened or thinned (effaced).  You will be tested for Group B streptococcus. This happens between 35 and 37 weeks. Your health care provider may ask you:  What your birth plan is.  How you are feeling.  If you are feeling the baby move.  If you have had any abnormal   symptoms, such as leaking fluid, bleeding, severe headaches, or abdominal cramping.  If you are using any tobacco products, including cigarettes, chewing tobacco, and electronic cigarettes.  If you have any questions. Other tests or screenings that may be performed during your third trimester include:  Blood tests that check for low iron levels (anemia).  Fetal testing to check the health, activity level, and growth of the fetus. Testing is done if you have certain medical conditions or if there are problems during the pregnancy.  Nonstress test  (NST). This test checks the health of your baby to make sure there are no signs of problems, such as the baby not getting enough oxygen. During this test, a belt is placed around your belly. The baby is made to move, and its heart rate is monitored during movement. What is false labor? False labor is a condition in which you feel small, irregular tightenings of the muscles in the womb (contractions) that usually go away with rest, changing position, or drinking water. These are called Braxton Hicks contractions. Contractions may last for hours, days, or even weeks before true labor sets in. If contractions come at regular intervals, become more frequent, increase in intensity, or become painful, you should see your health care provider. What are the signs of labor?  Abdominal cramps.  Regular contractions that start at 10 minutes apart and become stronger and more frequent with time.  Contractions that start on the top of the uterus and spread down to the lower abdomen and back.  Increased pelvic pressure and dull back pain.  A watery or bloody mucus discharge that comes from the vagina.  Leaking of amniotic fluid. This is also known as your "water breaking." It could be a slow trickle or a gush. Let your health care provider know if it has a color or strange odor. If you have any of these signs, call your health care provider right away, even if it is before your due date. Follow these instructions at home: Medicines  Follow your health care provider's instructions regarding medicine use. Specific medicines may be either safe or unsafe to take during pregnancy.  Take a prenatal vitamin that contains at least 600 micrograms (mcg) of folic acid.  If you develop constipation, try taking a stool softener if your health care provider approves. Eating and drinking   Eat a balanced diet that includes fresh fruits and vegetables, whole grains, good sources of protein such as meat, eggs, or tofu,  and low-fat dairy. Your health care provider will help you determine the amount of weight gain that is right for you.  Avoid raw meat and uncooked cheese. These carry germs that can cause birth defects in the baby.  If you have low calcium intake from food, talk to your health care provider about whether you should take a daily calcium supplement.  Eat four or five small meals rather than three large meals a day.  Limit foods that are high in fat and processed sugars, such as fried and sweet foods.  To prevent constipation: ? Drink enough fluid to keep your urine clear or pale yellow. ? Eat foods that are high in fiber, such as fresh fruits and vegetables, whole grains, and beans. Activity  Exercise only as directed by your health care provider. Most women can continue their usual exercise routine during pregnancy. Try to exercise for 30 minutes at least 5 days a week. Stop exercising if you experience uterine contractions.  Avoid heavy lifting.  Do   not exercise in extreme heat or humidity, or at high altitudes.  Wear low-heel, comfortable shoes.  Practice good posture.  You may continue to have sex unless your health care provider tells you otherwise. Relieving pain and discomfort  Take frequent breaks and rest with your legs elevated if you have leg cramps or low back pain.  Take warm sitz baths to soothe any pain or discomfort caused by hemorrhoids. Use hemorrhoid cream if your health care provider approves.  Wear a good support bra to prevent discomfort from breast tenderness.  If you develop varicose veins: ? Wear support pantyhose or compression stockings as told by your healthcare provider. ? Elevate your feet for 15 minutes, 3-4 times a day. Prenatal care  Write down your questions. Take them to your prenatal visits.  Keep all your prenatal visits as told by your health care provider. This is important. Safety  Wear your seat belt at all times when driving.  Make  a list of emergency phone numbers, including numbers for family, friends, the hospital, and police and fire departments. General instructions  Avoid cat litter boxes and soil used by cats. These carry germs that can cause birth defects in the baby. If you have a cat, ask someone to clean the litter box for you.  Do not travel far distances unless it is absolutely necessary and only with the approval of your health care provider.  Do not use hot tubs, steam rooms, or saunas.  Do not drink alcohol.  Do not use any products that contain nicotine or tobacco, such as cigarettes and e-cigarettes. If you need help quitting, ask your health care provider.  Do not use any medicinal herbs or unprescribed drugs. These chemicals affect the formation and growth of the baby.  Do not douche or use tampons or scented sanitary pads.  Do not cross your legs for long periods of time.  To prepare for the arrival of your baby: ? Take prenatal classes to understand, practice, and ask questions about labor and delivery. ? Make a trial run to the hospital. ? Visit the hospital and tour the maternity area. ? Arrange for maternity or paternity leave through employers. ? Arrange for family and friends to take care of pets while you are in the hospital. ? Purchase a rear-facing car seat and make sure you know how to install it in your car. ? Pack your hospital bag. ? Prepare the baby's nursery. Make sure to remove all pillows and stuffed animals from the baby's crib to prevent suffocation.  Visit your dentist if you have not gone during your pregnancy. Use a soft toothbrush to brush your teeth and be gentle when you floss. Contact a health care provider if:  You are unsure if you are in labor or if your water has broken.  You become dizzy.  You have mild pelvic cramps, pelvic pressure, or nagging pain in your abdominal area.  You have lower back pain.  You have persistent nausea, vomiting, or  diarrhea.  You have an unusual or bad smelling vaginal discharge.  You have pain when you urinate. Get help right away if:  Your water breaks before 37 weeks.  You have regular contractions less than 5 minutes apart before 37 weeks.  You have a fever.  You are leaking fluid from your vagina.  You have spotting or bleeding from your vagina.  You have severe abdominal pain or cramping.  You have rapid weight loss or weight gain.  You have   shortness of breath with chest pain.  You notice sudden or extreme swelling of your face, hands, ankles, feet, or legs.  Your baby makes fewer than 10 movements in 2 hours.  You have severe headaches that do not go away when you take medicine.  You have vision changes. Summary  The third trimester is from week 28 through week 40, months 7 through 9. The third trimester is a time when the unborn baby (fetus) is growing rapidly.  During the third trimester, your discomfort may increase as you and your baby continue to gain weight. You may have abdominal, leg, and back pain, sleeping problems, and an increased need to urinate.  During the third trimester your breasts will keep growing and they will continue to become tender. A yellow fluid (colostrum) may leak from your breasts. This is the first milk you are producing for your baby.  False labor is a condition in which you feel small, irregular tightenings of the muscles in the womb (contractions) that eventually go away. These are called Braxton Hicks contractions. Contractions may last for hours, days, or even weeks before true labor sets in.  Signs of labor can include: abdominal cramps; regular contractions that start at 10 minutes apart and become stronger and more frequent with time; watery or bloody mucus discharge that comes from the vagina; increased pelvic pressure and dull back pain; and leaking of amniotic fluid. This information is not intended to replace advice given to you by your  health care provider. Make sure you discuss any questions you have with your health care provider. Document Revised: 10/22/2018 Document Reviewed: 08/06/2016 Elsevier Patient Education  2020 [employer]  

## 2020-04-05 NOTE — Progress Notes (Signed)
Subjective:  Kathryn Rollins is a 29 y.o. G3P1011 at [redacted]w[redacted]d being seen today for ongoing prenatal care.  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  Patient reports no complaints.  Contractions: Not present. Vag. Bleeding: None.  Movement: Present. Denies leaking of fluid.   The following portions of the patient's history were reviewed and updated as appropriate: allergies, current medications, past family history, past medical history, past social history, past surgical history and problem list. Problem list updated.  Objective:   Vitals:   04/05/20 0914  BP: 114/78  Pulse: (!) 102  Weight: 181 lb 11.2 oz (82.4 kg)    Fetal Status: Fetal Heart Rate (bpm): 143   Movement: Present     General:  Alert, oriented and cooperative. Patient is in no acute distress.  Skin: Skin is warm and dry. No rash noted.   Cardiovascular: Normal heart rate noted  Respiratory: Normal respiratory effort, no problems with respiration noted  Abdomen: Soft, gravid, appropriate for gestational age. Pain/Pressure: Absent     Pelvic:  Cervical exam deferred        Extremities: Normal range of motion.  Edema: None  Mental Status: Normal mood and affect. Normal behavior. Normal judgment and thought content.   Urinalysis:      Assessment and Plan:  Pregnancy: G3P1011 at [redacted]w[redacted]d  1. Supervision of high risk pregnancy, antepartum Stable Glucola today Declined flu and Tdap Growth scan d/t h/o Covid ordered  2. History of pre-eclampsia in prior pregnancy, currently pregnant BP stable Continue with qd BASA  3. History of cesarean section Desires TOLAC. Consented today and  consent signed today  4. Alpha thalassemia silent carrier Declined genetic testing  5. 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 Please refer to After Visit Summary for other counseling recommendations.  Return in about 2 weeks (around 04/19/2020) for OB visit, face to face, MD only.   Hermina Staggers, MD

## 2020-04-06 ENCOUNTER — Ambulatory Visit: Payer: Medicaid Other | Attending: Obstetrics and Gynecology

## 2020-04-06 ENCOUNTER — Ambulatory Visit: Payer: Medicaid Other | Admitting: *Deleted

## 2020-04-06 DIAGNOSIS — Z758 Other problems related to medical facilities and other health care: Secondary | ICD-10-CM

## 2020-04-06 DIAGNOSIS — O0990 Supervision of high risk pregnancy, unspecified, unspecified trimester: Secondary | ICD-10-CM

## 2020-04-06 DIAGNOSIS — O34219 Maternal care for unspecified type scar from previous cesarean delivery: Secondary | ICD-10-CM

## 2020-04-06 DIAGNOSIS — O2693 Pregnancy related conditions, unspecified, third trimester: Secondary | ICD-10-CM

## 2020-04-06 DIAGNOSIS — O09293 Supervision of pregnancy with other poor reproductive or obstetric history, third trimester: Secondary | ICD-10-CM

## 2020-04-06 DIAGNOSIS — R7871 Abnormal lead level in blood: Secondary | ICD-10-CM | POA: Insufficient documentation

## 2020-04-06 DIAGNOSIS — O09299 Supervision of pregnancy with other poor reproductive or obstetric history, unspecified trimester: Secondary | ICD-10-CM

## 2020-04-06 DIAGNOSIS — Z789 Other specified health status: Secondary | ICD-10-CM | POA: Diagnosis present

## 2020-04-06 DIAGNOSIS — Z148 Genetic carrier of other disease: Secondary | ICD-10-CM

## 2020-04-06 DIAGNOSIS — Z3A28 28 weeks gestation of pregnancy: Secondary | ICD-10-CM

## 2020-04-06 LAB — CBC
Hematocrit: 32 % — ABNORMAL LOW (ref 34.0–46.6)
Hemoglobin: 10.8 g/dL — ABNORMAL LOW (ref 11.1–15.9)
MCH: 31.1 pg (ref 26.6–33.0)
MCHC: 33.8 g/dL (ref 31.5–35.7)
MCV: 92 fL (ref 79–97)
Platelets: 177 10*3/uL (ref 150–450)
RBC: 3.47 x10E6/uL — ABNORMAL LOW (ref 3.77–5.28)
RDW: 12.4 % (ref 11.7–15.4)
WBC: 6.6 10*3/uL (ref 3.4–10.8)

## 2020-04-06 LAB — GLUCOSE TOLERANCE, 2 HOURS W/ 1HR
Glucose, 1 hour: 125 mg/dL (ref 65–179)
Glucose, 2 hour: 135 mg/dL (ref 65–152)
Glucose, Fasting: 78 mg/dL (ref 65–91)

## 2020-04-06 LAB — RPR: RPR Ser Ql: NONREACTIVE

## 2020-04-06 LAB — HIV ANTIBODY (ROUTINE TESTING W REFLEX): HIV Screen 4th Generation wRfx: NONREACTIVE

## 2020-04-07 ENCOUNTER — Telehealth: Payer: Self-pay | Admitting: *Deleted

## 2020-04-07 NOTE — Telephone Encounter (Signed)
-----   Message from Kathryn Staggers, MD sent at 04/07/2020  7:43 AM EDT ----- Please let pt know that her Glucola test was normal.  Thanks [name]

## 2020-04-07 NOTE — Telephone Encounter (Signed)
I called Kathryn Rollins with [employer] [phone]. I informed her Glucola test was normal. I also reminded her of her next ob appointment. She voices understanding. Marigold Mom,RN

## 2020-04-12 ENCOUNTER — Telehealth: Payer: Self-pay | Admitting: *Deleted

## 2020-04-12 NOTE — Telephone Encounter (Addendum)
VM message left by pt's husband stating that his wife has a question or concern. She has not woken up in order to pee for the last 2 nights and wants to know if this is normal. He requested that we call her back.   9/30  1040  Called pt w/[employer] interpreter [phone] and informed her that there is no cause for concern if she is not waking up during the night to use the bathroom. At times, this can be because the position of the baby has changed and there may be less pressure on the bladder. It is normal either way- to need to use the bathroom or not. Pt voiced understanding and stated that she had no additional questions.

## 2020-04-19 ENCOUNTER — Ambulatory Visit (INDEPENDENT_AMBULATORY_CARE_PROVIDER_SITE_OTHER): Payer: Self-pay | Admitting: Obstetrics and Gynecology

## 2020-04-19 ENCOUNTER — Other Ambulatory Visit: Payer: Self-pay

## 2020-04-19 ENCOUNTER — Encounter: Payer: Self-pay | Admitting: Obstetrics and Gynecology

## 2020-04-19 VITALS — BP 116/73 | HR 108 | Wt 182.9 lb

## 2020-04-19 DIAGNOSIS — Z148 Genetic carrier of other disease: Secondary | ICD-10-CM

## 2020-04-19 DIAGNOSIS — Z789 Other specified health status: Secondary | ICD-10-CM

## 2020-04-19 DIAGNOSIS — D563 Thalassemia minor: Secondary | ICD-10-CM

## 2020-04-19 DIAGNOSIS — Z98891 History of uterine scar from previous surgery: Secondary | ICD-10-CM

## 2020-04-19 DIAGNOSIS — O0990 Supervision of high risk pregnancy, unspecified, unspecified trimester: Secondary | ICD-10-CM

## 2020-04-19 DIAGNOSIS — O09299 Supervision of pregnancy with other poor reproductive or obstetric history, unspecified trimester: Secondary | ICD-10-CM

## 2020-04-19 NOTE — Patient Instructions (Signed)
Third Trimester of Pregnancy The third trimester is from week 28 through week 40 (months 7 through 9). The third trimester is a time when the unborn baby (fetus) is growing rapidly. At the end of the ninth month, the fetus is about 20 inches in length and weighs 6-10 pounds. Body changes during your third trimester Your body will continue to go through many changes during pregnancy. The changes vary from woman to woman. During the third trimester:  Your weight will continue to increase. You can expect to gain 25-35 pounds (11-16 kg) by the end of the pregnancy.  You may begin to get stretch marks on your hips, abdomen, and breasts.  You may urinate more often because the fetus is moving lower into your pelvis and pressing on your bladder.  You may develop or continue to have heartburn. This is caused by increased hormones that slow down muscles in the digestive tract.  You may develop or continue to have constipation because increased hormones slow digestion and cause the muscles that push waste through your intestines to relax.  You may develop hemorrhoids. These are swollen veins (varicose veins) in the rectum that can itch or be painful.  You may develop swollen, bulging veins (varicose veins) in your legs.  You may have increased body aches in the pelvis, back, or thighs. This is due to weight gain and increased hormones that are relaxing your joints.  You may have changes in your hair. These can include thickening of your hair, rapid growth, and changes in texture. Some women also have hair loss during or after pregnancy, or hair that feels dry or thin. Your hair will most likely return to normal after your baby is born.  Your breasts will continue to grow and they will continue to become tender. A yellow fluid (colostrum) may leak from your breasts. This is the first milk you are producing for your baby.  Your belly button may stick out.  You may notice more swelling in your hands,  face, or ankles.  You may have increased tingling or numbness in your hands, arms, and legs. The skin on your belly may also feel numb.  You may feel short of breath because of your expanding uterus.  You may have more problems sleeping. This can be caused by the size of your belly, increased need to urinate, and an increase in your body's metabolism.  You may notice the fetus "dropping," or moving lower in your abdomen (lightening).  You may have increased vaginal discharge.  You may notice your joints feel loose and you may have pain around your pelvic bone. What to expect at prenatal visits You will have prenatal exams every 2 weeks until week 36. Then you will have weekly prenatal exams. During a routine prenatal visit:  You will be weighed to make sure you and the baby are growing normally.  Your blood pressure will be taken.  Your abdomen will be measured to track your baby's growth.  The fetal heartbeat will be listened to.  Any test results from the previous visit will be discussed.  You may have a cervical check near your due date to see if your cervix has softened or thinned (effaced).  You will be tested for Group B streptococcus. This happens between 35 and 37 weeks. Your health care provider may ask you:  What your birth plan is.  How you are feeling.  If you are feeling the baby move.  If you have had any abnormal   symptoms, such as leaking fluid, bleeding, severe headaches, or abdominal cramping.  If you are using any tobacco products, including cigarettes, chewing tobacco, and electronic cigarettes.  If you have any questions. Other tests or screenings that may be performed during your third trimester include:  Blood tests that check for low iron levels (anemia).  Fetal testing to check the health, activity level, and growth of the fetus. Testing is done if you have certain medical conditions or if there are problems during the pregnancy.  Nonstress test  (NST). This test checks the health of your baby to make sure there are no signs of problems, such as the baby not getting enough oxygen. During this test, a belt is placed around your belly. The baby is made to move, and its heart rate is monitored during movement. What is false labor? False labor is a condition in which you feel small, irregular tightenings of the muscles in the womb (contractions) that usually go away with rest, changing position, or drinking water. These are called Braxton Hicks contractions. Contractions may last for hours, days, or even weeks before true labor sets in. If contractions come at regular intervals, become more frequent, increase in intensity, or become painful, you should see your health care provider. What are the signs of labor?  Abdominal cramps.  Regular contractions that start at 10 minutes apart and become stronger and more frequent with time.  Contractions that start on the top of the uterus and spread down to the lower abdomen and back.  Increased pelvic pressure and dull back pain.  A watery or bloody mucus discharge that comes from the vagina.  Leaking of amniotic fluid. This is also known as your "water breaking." It could be a slow trickle or a gush. Let your health care provider know if it has a color or strange odor. If you have any of these signs, call your health care provider right away, even if it is before your due date. Follow these instructions at home: Medicines  Follow your health care provider's instructions regarding medicine use. Specific medicines may be either safe or unsafe to take during pregnancy.  Take a prenatal vitamin that contains at least 600 micrograms (mcg) of folic acid.  If you develop constipation, try taking a stool softener if your health care provider approves. Eating and drinking   Eat a balanced diet that includes fresh fruits and vegetables, whole grains, good sources of protein such as meat, eggs, or tofu,  and low-fat dairy. Your health care provider will help you determine the amount of weight gain that is right for you.  Avoid raw meat and uncooked cheese. These carry germs that can cause birth defects in the baby.  If you have low calcium intake from food, talk to your health care provider about whether you should take a daily calcium supplement.  Eat four or five small meals rather than three large meals a day.  Limit foods that are high in fat and processed sugars, such as fried and sweet foods.  To prevent constipation: ? Drink enough fluid to keep your urine clear or pale yellow. ? Eat foods that are high in fiber, such as fresh fruits and vegetables, whole grains, and beans. Activity  Exercise only as directed by your health care provider. Most women can continue their usual exercise routine during pregnancy. Try to exercise for 30 minutes at least 5 days a week. Stop exercising if you experience uterine contractions.  Avoid heavy lifting.  Do   not exercise in extreme heat or humidity, or at high altitudes.  Wear low-heel, comfortable shoes.  Practice good posture.  You may continue to have sex unless your health care provider tells you otherwise. Relieving pain and discomfort  Take frequent breaks and rest with your legs elevated if you have leg cramps or low back pain.  Take warm sitz baths to soothe any pain or discomfort caused by hemorrhoids. Use hemorrhoid cream if your health care provider approves.  Wear a good support bra to prevent discomfort from breast tenderness.  If you develop varicose veins: ? Wear support pantyhose or compression stockings as told by your healthcare provider. ? Elevate your feet for 15 minutes, 3-4 times a day. Prenatal care  Write down your questions. Take them to your prenatal visits.  Keep all your prenatal visits as told by your health care provider. This is important. Safety  Wear your seat belt at all times when driving.  Make  a list of emergency phone numbers, including numbers for family, friends, the hospital, and police and fire departments. General instructions  Avoid cat litter boxes and soil used by cats. These carry germs that can cause birth defects in the baby. If you have a cat, ask someone to clean the litter box for you.  Do not travel far distances unless it is absolutely necessary and only with the approval of your health care provider.  Do not use hot tubs, steam rooms, or saunas.  Do not drink alcohol.  Do not use any products that contain nicotine or tobacco, such as cigarettes and e-cigarettes. If you need help quitting, ask your health care provider.  Do not use any medicinal herbs or unprescribed drugs. These chemicals affect the formation and growth of the baby.  Do not douche or use tampons or scented sanitary pads.  Do not cross your legs for long periods of time.  To prepare for the arrival of your baby: ? Take prenatal classes to understand, practice, and ask questions about labor and delivery. ? Make a trial run to the hospital. ? Visit the hospital and tour the maternity area. ? Arrange for maternity or paternity leave through employers. ? Arrange for family and friends to take care of pets while you are in the hospital. ? Purchase a rear-facing car seat and make sure you know how to install it in your car. ? Pack your hospital bag. ? Prepare the baby's nursery. Make sure to remove all pillows and stuffed animals from the baby's crib to prevent suffocation.  Visit your dentist if you have not gone during your pregnancy. Use a soft toothbrush to brush your teeth and be gentle when you floss. Contact a health care provider if:  You are unsure if you are in labor or if your water has broken.  You become dizzy.  You have mild pelvic cramps, pelvic pressure, or nagging pain in your abdominal area.  You have lower back pain.  You have persistent nausea, vomiting, or  diarrhea.  You have an unusual or bad smelling vaginal discharge.  You have pain when you urinate. Get help right away if:  Your water breaks before 37 weeks.  You have regular contractions less than 5 minutes apart before 37 weeks.  You have a fever.  You are leaking fluid from your vagina.  You have spotting or bleeding from your vagina.  You have severe abdominal pain or cramping.  You have rapid weight loss or weight gain.  You have   shortness of breath with chest pain.  You notice sudden or extreme swelling of your face, hands, ankles, feet, or legs.  Your baby makes fewer than 10 movements in 2 hours.  You have severe headaches that do not go away when you take medicine.  You have vision changes. Summary  The third trimester is from week 28 through week 40, months 7 through 9. The third trimester is a time when the unborn baby (fetus) is growing rapidly.  During the third trimester, your discomfort may increase as you and your baby continue to gain weight. You may have abdominal, leg, and back pain, sleeping problems, and an increased need to urinate.  During the third trimester your breasts will keep growing and they will continue to become tender. A yellow fluid (colostrum) may leak from your breasts. This is the first milk you are producing for your baby.  False labor is a condition in which you feel small, irregular tightenings of the muscles in the womb (contractions) that eventually go away. These are called Braxton Hicks contractions. Contractions may last for hours, days, or even weeks before true labor sets in.  Signs of labor can include: abdominal cramps; regular contractions that start at 10 minutes apart and become stronger and more frequent with time; watery or bloody mucus discharge that comes from the vagina; increased pelvic pressure and dull back pain; and leaking of amniotic fluid. This information is not intended to replace advice given to you by your  health care provider. Make sure you discuss any questions you have with your health care provider. Document Revised: 10/22/2018 Document Reviewed: 08/06/2016 Elsevier Patient Education  2020 [employer]  

## 2020-04-19 NOTE — Progress Notes (Signed)
Subjective:  Kathryn Rollins is a 29 y.o. G3P1011 at [redacted]w[redacted]d being seen today for ongoing prenatal care.  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  Patient reports no complaints.  Contractions: Not present. Vag. Bleeding: None.  Movement: Present. Denies leaking of fluid.   The following portions of the patient's history were reviewed and updated as appropriate: allergies, current medications, past family history, past medical history, past social history, past surgical history and problem list. Problem list updated.  Objective:   Vitals:   04/19/20 1052  BP: 116/73  Pulse: (!) 108  Weight: 182 lb 14.4 oz (83 kg)    Fetal Status: Fetal Heart Rate (bpm): 145   Movement: Present     General:  Alert, oriented and cooperative. Patient is in no acute distress.  Skin: Skin is warm and dry. No rash noted.   Cardiovascular: Normal heart rate noted  Respiratory: Normal respiratory effort, no problems with respiration noted  Abdomen: Soft, gravid, appropriate for gestational age. Pain/Pressure: Present     Pelvic:  Cervical exam performed        Extremities: Normal range of motion.  Edema: None  Mental Status: Normal mood and affect. Normal behavior. Normal judgment and thought content.   Urinalysis:      Assessment and Plan:  Pregnancy: G3P1011 at [redacted]w[redacted]d  1. Supervision of high risk pregnancy, antepartum Stable Growth 90 % on 04/06/20  2. History of pre-eclampsia in prior pregnancy, currently pregnant BP stable Continue with BASA and monitor BP  3. History of cesarean section Desires TOLAC, consented  4. Alpha thalassemia silent carrier S/P  genetics  5. 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 Language barrier Live interrupter used during today's visit  Preterm labor symptoms and general obstetric precautions including but not limited to vaginal bleeding, contractions, leaking of fluid and fetal movement were reviewed in detail with the patient. Please refer to After Visit Summary for other counseling recommendations.  Return in about 2 weeks (around 05/03/2020) for OB visit, face to face, MD only.   Hermina Staggers, MD

## 2020-04-24 ENCOUNTER — Telehealth: Payer: Self-pay | Admitting: *Deleted

## 2020-04-24 NOTE — Telephone Encounter (Signed)
A gentleman called for the patient stating she needs a call back with a [place] interpreter. He did not say why the pt needed a return call. The number he gave to call back was [phone]

## 2020-04-27 NOTE — Telephone Encounter (Signed)
Called pt with [facility] interpreter ID [phone]. Called pt at mobile and home number, both numbers were not available. Called pt contact number, not available.

## 2020-05-01 ENCOUNTER — Telehealth: Payer: Self-pay | Admitting: Family Medicine

## 2020-05-01 NOTE — Telephone Encounter (Signed)
Spoke with husband to speak with a nurse about the pain she has been having since last week. He wants a nurse to call him back for her to come in and see a doctor.

## 2020-05-01 NOTE — Telephone Encounter (Signed)
Patient is now 32 weeks having pain in her belly where she had her c-section. It has been hurting all weekend, and she would like to see a doctor.

## 2020-05-02 NOTE — Telephone Encounter (Signed)
Called patient with [employer] [phone] and told patient I was returning her phone call. Patient states over the weekend she was having a lot of pain around her c-section incision as well as some swelling. Patient states her incision had the appearance of pus but nothing was coming out. She states she hasn't had pain since the weekend and there is no swelling now. Told patient I feel reassured since she is feeling better. Advised she take tylenol if the pain reoccurs and to have the doctor look at the area during her appt on Friday. Patient verbalized understanding and reports blood pressure last week of 110/65 and 101/72. Told patient those are good blood pressure readings & reviewed Friday's appt information. Patient verbalized understanding.

## 2020-05-05 ENCOUNTER — Ambulatory Visit (INDEPENDENT_AMBULATORY_CARE_PROVIDER_SITE_OTHER): Payer: Self-pay | Admitting: Obstetrics and Gynecology

## 2020-05-05 ENCOUNTER — Encounter: Payer: Self-pay | Admitting: Obstetrics and Gynecology

## 2020-05-05 ENCOUNTER — Other Ambulatory Visit: Payer: Self-pay

## 2020-05-05 VITALS — BP 105/68 | Wt 185.0 lb

## 2020-05-05 DIAGNOSIS — Z98891 History of uterine scar from previous surgery: Secondary | ICD-10-CM

## 2020-05-05 DIAGNOSIS — Z789 Other specified health status: Secondary | ICD-10-CM

## 2020-05-05 DIAGNOSIS — O0990 Supervision of high risk pregnancy, unspecified, unspecified trimester: Secondary | ICD-10-CM

## 2020-05-05 DIAGNOSIS — O0993 Supervision of high risk pregnancy, unspecified, third trimester: Secondary | ICD-10-CM

## 2020-05-05 DIAGNOSIS — O09299 Supervision of pregnancy with other poor reproductive or obstetric history, unspecified trimester: Secondary | ICD-10-CM

## 2020-05-05 DIAGNOSIS — Z3A32 32 weeks gestation of pregnancy: Secondary | ICD-10-CM

## 2020-05-05 NOTE — Progress Notes (Signed)
   PRENATAL VISIT NOTE  Subjective:  Kathryn Rollins is a 29 y.o. G3P1011 at [redacted]w[redacted]d being seen today for ongoing prenatal care.  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  Patient reports no complaints and incisional pain.  Contractions: Not present. Vag. Bleeding: None.  Movement: Present. Denies leaking of fluid.   The following portions of the patient's history were reviewed and updated as appropriate: allergies, current medications, past family history, past medical history, past social history, past surgical history and problem list.   Objective:   Vitals:   05/05/20 1053  BP: 105/68  Weight: 185 lb (83.9 kg)    Fetal Status: Fetal Heart Rate (bpm): 146   Movement: Present     General:  Alert, oriented and cooperative. Patient is in no acute distress.  Skin: Skin is warm and dry. No rash noted.   Cardiovascular: Normal heart rate noted  Respiratory: Normal respiratory effort, no problems with respiration noted  Abdomen: Soft, gravid, appropriate for gestational age.  Pain/Pressure: Absent     Pelvic: Cervical exam deferred        Extremities: Normal range of motion.  Edema: None  Mental Status: Normal mood and affect. Normal behavior. Normal judgment and thought content.   Assessment and Plan:  Pregnancy: G3P1011 at [redacted]w[redacted]d 1. Supervision of high risk pregnancy, antepartum Patient is doing well with ingrown hair on right corner of incision Small amount of purulent discharge expressed. Area slightly tender to touch. No erythema. Advised patient to apply warm compress to the area  2. History of cesarean section Desires TOLAC  3. History of  pre-eclampsia in prior pregnancy, currently pregnant Normotensive Continue ASA  4. Language barrier [place] language used to communicate with patient  Preterm labor symptoms and general obstetric precautions including but not limited to vaginal bleeding, contractions, leaking of fluid and fetal movement were reviewed in detail with the patient. Please refer to After Visit Summary for other counseling recommendations.   Return in about 2 weeks (around 05/19/2020) for in person, ROB, High risk.  Future Appointments  Date Time Provider Department Center  05/18/2020  9:35 AM Adam Phenix, MD [facility]  05/25/2020 10:35 AM Jolayne Panther, Gigi Gin, MD [facility]  06/01/2020  9:35 AM Kyon Bentler, Gigi Gin, MD [facility]  06/07/2020  9:35 AM Adam Phenix, MD [facility]    Catalina Antigua, MD

## 2020-05-18 ENCOUNTER — Ambulatory Visit (INDEPENDENT_AMBULATORY_CARE_PROVIDER_SITE_OTHER): Payer: Medicaid Other | Admitting: Obstetrics & Gynecology

## 2020-05-18 ENCOUNTER — Other Ambulatory Visit: Payer: Self-pay

## 2020-05-18 ENCOUNTER — Encounter: Payer: Self-pay | Admitting: Obstetrics & Gynecology

## 2020-05-18 VITALS — BP 107/81 | HR 106 | Wt 189.7 lb

## 2020-05-18 DIAGNOSIS — O0990 Supervision of high risk pregnancy, unspecified, unspecified trimester: Secondary | ICD-10-CM

## 2020-05-18 DIAGNOSIS — Z98891 History of uterine scar from previous surgery: Secondary | ICD-10-CM

## 2020-05-18 DIAGNOSIS — O09299 Supervision of pregnancy with other poor reproductive or obstetric history, unspecified trimester: Secondary | ICD-10-CM

## 2020-05-18 NOTE — Progress Notes (Signed)
   PRENATAL VISIT NOTE  Subjective:  Kathryn Rollins is a 29 y.o. G3P1011 at [redacted]w[redacted]d being seen today for ongoing prenatal care.  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  Patient reports decreased FM.  Contractions: Not present. Vag. Bleeding: None.  Movement: (!) Decreased. Denies leaking of fluid.   The following portions of the patient's history were reviewed and updated as appropriate: allergies, current medications, past family history, past medical history, past social history, past surgical history and problem list.   Objective:   Vitals:   05/18/20 0952  BP: 107/81  Pulse: (!) 106  Weight: 189 lb 11.2 oz (86 kg)    Fetal Status: Fetal Heart Rate (bpm): 147 Fundal Height: 36 cm Movement: (!) Decreased     General:  Alert, oriented and cooperative. Patient is in no acute distress.  Skin: Skin is warm and dry. No rash noted.   Cardiovascular: Normal heart rate noted  Respiratory: Normal respiratory effort, no problems with respiration noted  Abdomen: Soft, gravid, appropriate for gestational age.  Pain/Pressure: Present     Pelvic: Cervical exam deferred        Extremities: Normal range of motion.  Edema: None  Mental Status: Normal mood and affect. Normal behavior. Normal judgment and thought content.   Assessment and Plan:  Pregnancy: G3P1011 at [redacted]w[redacted]d 1. Supervision of high risk pregnancy, antepartum S>D - [place] [facility] FOLLOW UP; Future Decreased FM today with active fetus and reactive NST today, given reassurance. Uterine irritability  noted 2. History of pre-eclampsia in prior pregnancy, currently pregnant Nl BP, takes ASA - [place] [facility]  FOLLOW UP; Future  3. History of cesarean section Plans TOLAC  Preterm labor symptoms and general obstetric precautions including but not limited to vaginal bleeding, contractions, leaking of fluid and fetal movement were reviewed in detail with the patient. Please refer to After Visit Summary for other counseling recommendations.   Return in about 1 week (around 05/25/2020) for , needs to address problem with Medicaid.  Future Appointments  Date Time Provider Department Center  05/25/2020 10:35 AM Constant, Gigi Gin, MD [facility]  06/01/2020  9:35 AM Constant, Gigi Gin, MD [facility]  06/07/2020  9:35 AM Adam Phenix, MD [facility]    Scheryl Darter, MD

## 2020-05-18 NOTE — Patient Instructions (Signed)

## 2020-05-18 NOTE — Progress Notes (Signed)
States hasn't felt baby yet this am.States usually moves a lot but maybe just didn't notice it this am.

## 2020-05-25 ENCOUNTER — Ambulatory Visit (INDEPENDENT_AMBULATORY_CARE_PROVIDER_SITE_OTHER): Payer: Medicaid Other | Admitting: Obstetrics and Gynecology

## 2020-05-25 ENCOUNTER — Other Ambulatory Visit: Payer: Self-pay

## 2020-05-25 ENCOUNTER — Encounter: Payer: Self-pay | Admitting: Obstetrics and Gynecology

## 2020-05-25 VITALS — BP 106/77 | HR 107 | Wt 189.5 lb

## 2020-05-25 DIAGNOSIS — Z3A35 35 weeks gestation of pregnancy: Secondary | ICD-10-CM

## 2020-05-25 DIAGNOSIS — O0990 Supervision of high risk pregnancy, unspecified, unspecified trimester: Secondary | ICD-10-CM

## 2020-05-25 DIAGNOSIS — O09293 Supervision of pregnancy with other poor reproductive or obstetric history, third trimester: Secondary | ICD-10-CM

## 2020-05-25 DIAGNOSIS — Z98891 History of uterine scar from previous surgery: Secondary | ICD-10-CM

## 2020-05-25 DIAGNOSIS — O0993 Supervision of high risk pregnancy, unspecified, third trimester: Secondary | ICD-10-CM

## 2020-05-25 DIAGNOSIS — O09299 Supervision of pregnancy with other poor reproductive or obstetric history, unspecified trimester: Secondary | ICD-10-CM

## 2020-05-25 NOTE — Progress Notes (Signed)
   PRENATAL VISIT NOTE  Subjective:  Kathryn Rollins is a 29 y.o. G3P1011 at [redacted]w[redacted]d being seen today for ongoing prenatal care.  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  Patient reports no complaints.  Contractions: Not present. Vag. Bleeding: None.  Movement: Present. Denies leaking of fluid.   The following portions of the patient's history were reviewed and updated as appropriate: allergies, current medications, past family history, past medical history, past social history, past surgical history and problem list.   Objective:   Vitals:   05/25/20 1052  BP: 106/77  Pulse: (!) 107  Weight: 189 lb 8 oz (86 kg)    Fetal Status: Fetal Heart Rate (bpm): 140 Fundal Height: 36 cm Movement: Present     General:  Alert, oriented and cooperative. Patient is in no acute distress.  Skin: Skin is warm and dry. No rash noted.   Cardiovascular: Normal heart rate noted  Respiratory: Normal respiratory effort, no problems with respiration noted  Abdomen: Soft, gravid, appropriate for gestational age.  Pain/Pressure: Present     Pelvic: Cervical exam deferred        Extremities: Normal range of motion.  Edema: None  Mental Status: Normal mood and affect. Normal behavior. Normal judgment and thought content.   Assessment and Plan:  Pregnancy: G3P1011 at [redacted]w[redacted]d 1. Supervision of high risk pregnancy, antepartum Patient is doing well without complaints Cultures next visit  2. History of cesarean section Plans TOLAC  3. History of pre-eclampsia in prior pregnancy, currently pregnant Normotensive and asymptomatic  Preterm labor symptoms and general obstetric  precautions including but not limited to vaginal bleeding, contractions, leaking of fluid and fetal movement were reviewed in detail with the patient. Please refer to After Visit Summary for other counseling recommendations.   Return in about 1 week (around 06/01/2020) for in person, ROB, High risk/GBS.  Future Appointments  Date Time Provider Department Center  06/01/2020  9:35 AM Loni Abdon, Gigi Gin, MD [facility]  06/07/2020  9:35 AM Adam Phenix, MD [facility]    Catalina Antigua, MD

## 2020-05-25 NOTE — Progress Notes (Signed)
Video Interpreter # [id number]

## 2020-06-01 ENCOUNTER — Ambulatory Visit (INDEPENDENT_AMBULATORY_CARE_PROVIDER_SITE_OTHER): Payer: Medicaid Other | Admitting: Obstetrics and Gynecology

## 2020-06-01 ENCOUNTER — Other Ambulatory Visit (HOSPITAL_COMMUNITY)
Admission: RE | Admit: 2020-06-01 | Discharge: 2020-06-01 | Disposition: A | Discharge status: Home / Self Care | Payer: Medicaid Other | Source: Ambulatory Visit | Attending: Obstetrics and Gynecology | Admitting: Obstetrics and Gynecology

## 2020-06-01 ENCOUNTER — Other Ambulatory Visit: Payer: Self-pay

## 2020-06-01 ENCOUNTER — Encounter: Payer: Self-pay | Admitting: Obstetrics and Gynecology

## 2020-06-01 VITALS — BP 122/81 | HR 112 | Wt 192.6 lb

## 2020-06-01 DIAGNOSIS — O09299 Supervision of pregnancy with other poor reproductive or obstetric history, unspecified trimester: Secondary | ICD-10-CM

## 2020-06-01 DIAGNOSIS — O0993 Supervision of high risk pregnancy, unspecified, third trimester: Secondary | ICD-10-CM

## 2020-06-01 DIAGNOSIS — Z3A36 36 weeks gestation of pregnancy: Secondary | ICD-10-CM

## 2020-06-01 DIAGNOSIS — O0990 Supervision of high risk pregnancy, unspecified, unspecified trimester: Secondary | ICD-10-CM | POA: Diagnosis not present

## 2020-06-01 DIAGNOSIS — Z98891 History of uterine scar from previous surgery: Secondary | ICD-10-CM

## 2020-06-01 DIAGNOSIS — O09293 Supervision of pregnancy with other poor reproductive or obstetric history, third trimester: Secondary | ICD-10-CM

## 2020-06-01 NOTE — Addendum Note (Signed)
Addended by: Maxwell Marion E on: 06/01/2020 10:46 AM   Modules accepted: Orders

## 2020-06-01 NOTE — Patient Instructions (Signed)
AREA PEDIATRIC/FAMILY PRACTICE PHYSICIANS  [place] ([address]) . [facility] o Chambliss, MD; Eniola, MD; Hale, MD; Hensel, MD; McDiarmid, MD; McIntyer, MD; Neal, MD; Walden, MD o [address] [place], [place] [address] o [phone] o Mon-Fri 8:30-12:30, 1:30-5:00 o Providers come to see babies at [facility] o Accepting Medicaid . [facility] o Limited providers who accept newborns: Koirala, MD; Morrow, MD; Wolters, MD o [address], [place], [place] [address] o [phone] o Mon-Fri 8:00-5:30 o Babies seen by providers at [facility] o Does NOT accept Medicaid o Please call early in hospitalization for appointment (limited availability)  . [facility] o Mulberry, MD o [address] [place], [place] [address] o [phone] o Mon, Tue, Thur, Fri 8:30-5:00, Wed 10:00-7:00 (closed 1-2pm) o Babies seen by [facility] providers o Accepting Medicaid . [facility] o Rubin, MD o [address], [place], [place] [address] o [phone] o Mon-Fri 8:30-5:00, Sat 8:30-12:00 o Provider comes to see babies at [facility] o Accepting Medicaid o Must have been referred from current patients or contacted office prior to delivery . [facility] ([facility] o Brown, MD; Chandler, MD; Ettefagh, MD; Grant, MD; Lester, MD; McCormick, MD; McQueen, MD; Prose, MD; Simha, MD; Stanley, MD; Stryffeler, NP; Tebben, NP o [address], [place], [place] [address] o [phone] o Mon, Tue, Thur, Fri 8:30-5:30, Wed 9:30-5:30, Sat 8:30-12:30 o Babies seen by [facility] providers o Accepting Medicaid o Only accepting infants of first-time parents or siblings of current patients o Hospital discharge coordinator will make follow-up appointment . Jack Amos o [address],  [place], [place]  [address] o [phone]   Fax - [phone] . [facility] o [address], [place], [place]  [address] o Phone - [phone]   Fax - [phone] . Shilpa Gosrani o [address], [place], [place]  [address] o [phone]  [place] ([address]) . [facility] o Bates, MD; [place], MD; Cooper, Cox, MD; MD; Davis, MD; Dovico, MD; Ettefaugh, MD; Little, MD; Lowe, MD; Keiffer, MD; Melvin, MD; Sumner, MD; Williams, MD o [address], [place], [place] [address] o [phone] o Mon-Fri 8:30-5:00 (extended evenings Mon-Thur as needed), Sat-Sun 10:00-1:00 o Providers come to see babies at [facility] o Accepting Medicaid for families of first-time babies and families with all children in the household age 3 and under. Must register with office prior to making appointment (M-F only). . [facility] o Henson, NP; Knapp, MD; Lalonde, MD; Tysinger, PA o [address] [place], [place] [address] o [phone] o Mon-Fri 8:00-5:00 o Babies seen by providers at [facility] o Does NOT accept Medicaid/Commercial Insurance Only . [facility] ([facility]  o Artis, MD; Barnes, MD; Bratton, MD; Coccaro, MD; Lockett Gardner, MD; Kramer, MD; Marshall, MD; Netherton, MD; Poleto, MD; Skinner, MD o [address] [place], [place] [address] o [phone] o Mon-Fri 8:30-5:30, Sat (Oct.-Mar.) 9:00-1:00 o Babies seen by providers at [facility] o Accepting Medicaid  [place] ([address]) . [facility] o Reid, MD; Warner, MD o [address], [place], [place] [address] o [phone] o Mon-Fri 8:30-5:00, Sat 8:30-12:00 o Providers come to see babies at [facility] o Does NOT accept Medicaid . [facility] o Becker, PA; Hagler, MD; Scifres, PA; Sun, MD; Swayne, MD o [address],  [place], [place] [address] o [phone] o Mon-Fri 8:00-5:00 o Babies seen by providers at [facility] o Does NOT accept Medicaid o Only accepting babies of parents who are patients o Please call early in hospitalization for appointment (limited availability) . [facility] o Clark, MD; Frye, MD; Kelleher, MD; Mack, NP; Miller, MD; O'Keller, MD; Patterson, NP; Pudlo, MD; Puzio, MD; Thomas, MD; Tucker, MD; Twiselton, MD o [address], [place], [place] [address] o [phone] o Mon-Fri 8:00-5:00, Sat 9:00-12:00 o Providers come to see babies at [facility] o Does NOT accept Medicaid  [place] ([address]) . [facility] o Limited providers accepting new patients: Brake, NP; Wharton, PA o [address], [place], [place] [address] o [phone] o Mon-Fri 8:00-5:00 o Babies seen by providers at [facility] o Does NOT accept Medicaid o Only accepting babies of parents who are patients o Please call early in hospitalization for appointment (limited availability) . [facility] o Gay, MD; Quinlan, MD o [address] [place], [place] [address] o [phone] (press 1 to schedule appointment) o Mon-Fri 8:00-5:00 o Providers come to see babies at [facility] o Does NOT accept Medicaid . [facility] o Mazer, MD o [address] [place], [place] [address] o [phone] o Mon-Fri 8:30-5:00 (lunch 12:30-1:00), extended hours by appointment only Wed 5:00-6:30 o Babies seen by [facility] providers o Accepting Medicaid . [facility] o Banks, MD; Jordan, MD; Koberlein, MD o [address], [place], [place] [address] o [phone] o Mon-Fri 8:00-5:00 o Babies seen by [facility] providers o Does NOT accept Medicaid . [facility] o Parker, MD; Hunter, MD; Wallace, DO o [address] [place], [place]  [address] o [phone] o Mon-Fri 8:00-5:00 o Babies seen by [facility] providers o Does NOT accept Medicaid . [facility] o Brandon, PA; Brecken, PA; Christy, NP; Dees, MD; DeClaire, MD; DeWeese, MD; Hansen, NP; Mills, NP; Parrish, NP; Smoot, NP; Summer, MD; Vapne, MD o [address] [place], [place] [address] o [phone] o Mon-Fri 8:30-5:00, Sat 10:00-1:00 o Providers come to see babies at [facility] o Does NOT accept Medicaid o Free prenatal information session Tuesdays at 4:45pm . [facility] o Bouska, MD; Gordon, PA; [place], PA; Weber, PA o [address] [place] [address] o [phone] o Mon-Fri 7:30-5:30 o Babies seen by [facility] providers . [facility] o [address], [place], [place]  [address] o [phone]   Fax - [phone]  [place] ([address]) . [facility] o Reese, MD o [address] [place], [place] [address] o [phone] o Mon-Thur 8:00-6:00 o Providers come to see babies at [facility] o Accepting Medicaid . [facility] o Anderson, NP; Badger, MD; Beal, PA; Spencer, PA o [address] [place], [place] [address] o [phone] o Mon-Thur 7:30-7:30, Fri 7:30-4:30 o Babies seen by [facility] providers o Accepting Medicaid . [facility] o Agbuya, MD; Klett, NP; Romgoolam, MD o [address], [place], [place] [address] o [phone] o Mon-Fri 8:30-5:00, Sat 8:30-12:00 o Providers come to see babies at [facility] o Accepting Medicaid o Must have "Meet & Greet" appointment at office prior to delivery . [facility]  [facility] ) o McCord,   MD; Wallace, MD; Wood, MD o [address], [place], [place] [address] o [phone] o Mon-Wed 8:00-6:00, Thur-Fri 8:00-5:00, Sat 9:00-12:00 o Providers come to  see babies at [facility] o Does NOT accept Medicaid o Only accepting siblings of current patients . [facility] [place]  o [address], [place], [place]  [address] o [phone]   Fax - [phone] . [facility] o [address], [place], [place]  [address] o [phone]   Fax - [phone]  [place] ([address] & [address]) . [facility] o Cirigliano, DO; Matthews, DO o [address] [place], [place] [address] o [phone] o Mon-Fri 7:00-5:00 o Babies seen by [facility] providers o Does NOT accept Medicaid . [facility] o Briscoe, MD; Howley, PA; Moreira, PA o [address], [place], [place] [address] o [phone] o Mon-Fri 8:00-5:00 o Babies seen by [facility] providers o Accepting Medicaid . [facility] o Boyd, MD; Church, PA; Jones, NP; Osborn, PA o [address], [place], [place] [address] o [phone] o Mon-Fri 8:00-5:00 o Babies seen by providers at [facility] o Accepting Medicaid  [place] ([address]) . [facility] [place] o Wendling, DO o [address] [place], [place] [address] o [phone] o Mon-Fri 8:00-5:00 o Babies seen by [facility] providers o Does NOT accept Medicaid o Limited availability, please call early in hospitalization to schedule follow-up . [facility] o Calderon, PA; Cummings, MD; [place], MD; Martin, PA; Olson, MD; VanDeven, PA o [address] [place], [place], [place] [address] o [phone] o Mon-Fri 8:30-5:00, Sat 9:00-12:00 o Babies seen by providers at [facility] o Accepting Medicaid o Please register online then schedule online or call office o [email or web address] . [facility] o Hunter, NP; Kumar, MD; Martin Rogers, PA o [address], [place], [place] [address] o [phone] o Mon-Fri 8:00-5:00 o Babies seen by providers at [facility] o Accepting Medicaid . [facility] o Ke[place], MD; Kristi Fleenor, NP; West, MD o [address], [place], [place] [address] o [phone] o Mon-Fri 8:00-5:30, Sat&Sun by appointment (phones open at 8:30) o Babies seen by [facility] providers o Accepting Medicaid o Must be a first-time baby or sibling of current patient . [facility] [place]  o [address], [place], [place]  [address] o [phone]   Fax - [phone]  [place] ([address]) . [facility] o Brown, PA; Cowen, PA; Rice, MD; Helton, PA; Spry, MD o [address] [place], [place] [address] o [phone] o Mon-Thur 8:00-7:00, Fri 8:00-5:00, Sat 8:00-12:00, Sun 9:00-12:00 o Babies seen by [facility] providers o Accepting Medicaid . [facility] [place] o Coe-Goins, MD; Marshall, MD; Pierre-Louis, MD o [address] [place] [address], [place], [place] [address] o [phone] o Mon-Thur 8:00-5:00 o Babies seen by providers at [facility] o Accepting Medicaid . [facility] o Bratton, MD; Coe-Goins, MD; Hayes, MD; Lewis, MD; List, MD; Lott, MD; Marshall, MD; Moran, MD; O'Neal, MD; Pierre-Louis, MD; Pitonzo, MD; Scholer, MD; Spangle, MD o [address] [place], [place]   [address] o [phone] o Mon-Fri 8:00-5:30, Sat (Oct.-Mar.) 9:00-1:00 o Babies seen by providers at [facility] o Accepting Medicaid o Must fill out new patient packet, available online at [email or web address] . [facility] ([facility] o Friddle, NP; Harris, NP; Kelly, NP; Logan, MD;  Melvin, PA; Poth, MD; Ramadoss, MD; Stanton, NP o [address], [place], [place] [address] o [phone] o Mon-Thur 8:00-5:30, Fri 8:00-5:00 o Babies seen by providers at [facility] o Accepting Medicaid  [place] ([address]) . [facility] o Dixon, PA; Las Flores, MD; Pickard, MD; Tapia, PA o [address], [place], [place] [address] o [phone] o Mon-Fri 8:00-5:00 o Babies seen by providers at [facility] o Accepting Medicaid   [place] ([address]) . [facility] [place] o Masneri, DO; Meyers, MD; Nelson, PA o [address], [place], [place] [address] o [phone] o Mon-Fri 8:00-5:00 o Babies seen by providers at [facility] o Does NOT accept Medicaid o Limited appointment availability, please call early in hospitalization  . [facility] [place] o Kunedd, DO; McGowen, MD o [address], [place], [place] [address] o [phone] o Mon-Fri 8:00-5:00 o Babies seen by [facility] providers o Does NOT accept Medicaid . [facility] [place] o Cameron, MD; MacDonald, MD; Michaels, PA; Nayak, MD o [address] [place] [address], [place], [place] [address] o [phone] o Mon-Fri 8:00-5:00 o After hours clinic ([address] [place], [place] [address]) [phone] Mon-Fri 5:00-8:00, Sat 12:00-6:00, Sun 10:00-4:00 o Babies seen by [facility] providers o Accepting Medicaid . [facility] [place] o [address], [place], [place]  [address] o [phone]   Fax - [phone]  [place] ([address]) . [facility] [place] o Andy, MD o [address], [place], [place] [address] o [phone] o Mon-Fri 8:00-5:00 o Babies seen by [facility] providers o Does NOT accept Medicaid . [facility] [place] ([facility] [place]) o Eksir, MD o [address], [place], [place]  [address] o [phone] o Mon-Thur 8:00-7:00, Fri 8:00-5:00, Sat 8:00-12:00 o Babies seen by providers at [facility] o Accepting Medicaid - but does not have vaccinations in office (must be received elsewhere) o Limited availability, please call early in hospitalization  [place] ([address]) . [facility]  o Charlene Flemming, MD o [address], [place]  [address] o [phone]  Fax [phone]   

## 2020-06-01 NOTE — Progress Notes (Signed)
   PRENATAL VISIT NOTE  Subjective:  Kathryn Rollins is a 29 y.o. G3P1011 at [redacted]w[redacted]d being seen today for ongoing prenatal care.  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  Patient reports no complaints.  Contractions: Not present. Vag. Bleeding: None.  Movement: Present. Denies leaking of fluid.   The following portions of the patient's history were reviewed and updated as appropriate: allergies, current medications, past family history, past medical history, past social history, past surgical history and problem list.   Objective:   Vitals:   06/01/20 0941  BP: 122/81  Pulse: (!) 112  Weight: 192 lb 9.6 oz (87.4 kg)    Fetal Status: Fetal Heart Rate (bpm): 138 Fundal Height: 37 cm Movement: Present  Presentation: Vertex  General:  Alert, oriented and cooperative. Patient is in no acute distress.  Skin: Skin is warm and dry. No rash noted.   Cardiovascular: Normal heart rate noted  Respiratory: Normal respiratory effort, no problems with respiration noted  Abdomen: Soft, gravid, appropriate for gestational age.  Pain/Pressure: Absent     Pelvic: Cervical exam performed in the presence of a chaperone Dilation: Closed Effacement (%): Thick Station: Ballotable  Extremities: Normal range of motion.  Edema: None  Mental Status: Normal mood and affect. Normal behavior. Normal judgment and thought content.   Assessment and Plan:  Pregnancy: G3P1011 at [redacted]w[redacted]d 1. Supervision of high risk pregnancy, antepartum Patient is doing well without complaints Cultures today - GC/Chlamydia probe amp ([place])not at [facility] - Culture, beta strep (group b only)  2.  History of cesarean section Desires TOLAC  3. History of pre-eclampsia in prior pregnancy, currently pregnant Normotensive and without symptoms  Preterm labor symptoms and general obstetric precautions including but not limited to vaginal bleeding, contractions, leaking of fluid and fetal movement were reviewed in detail with the patient. Please refer to After Visit Summary for other counseling recommendations.   Return in about 1 week (around 06/08/2020) for in person, ROB, High risk.  Future Appointments  Date Time Provider Department Center  06/07/2020  9:35 AM Adam Phenix, MD [facility]    Catalina Antigua, MD

## 2020-06-02 LAB — CERVICOVAGINAL ANCILLARY ONLY
Bacterial Vaginitis (gardnerella): NEGATIVE
Candida Glabrata: NEGATIVE
Candida Vaginitis: NEGATIVE
Chlamydia: NEGATIVE
Comment: NEGATIVE
Comment: NEGATIVE
Comment: NEGATIVE
Comment: NEGATIVE
Comment: NEGATIVE
Comment: NORMAL
Neisseria Gonorrhea: NEGATIVE
Trichomonas: NEGATIVE

## 2020-06-05 LAB — CULTURE, BETA STREP (GROUP B ONLY): Strep Gp B Culture: NEGATIVE

## 2020-06-07 ENCOUNTER — Other Ambulatory Visit: Payer: Self-pay

## 2020-06-07 ENCOUNTER — Ambulatory Visit (INDEPENDENT_AMBULATORY_CARE_PROVIDER_SITE_OTHER): Payer: Medicaid Other | Admitting: Obstetrics & Gynecology

## 2020-06-07 VITALS — BP 116/80 | HR 105 | Wt 192.6 lb

## 2020-06-07 DIAGNOSIS — Z3A37 37 weeks gestation of pregnancy: Secondary | ICD-10-CM

## 2020-06-07 DIAGNOSIS — O0990 Supervision of high risk pregnancy, unspecified, unspecified trimester: Secondary | ICD-10-CM

## 2020-06-07 DIAGNOSIS — O0993 Supervision of high risk pregnancy, unspecified, third trimester: Secondary | ICD-10-CM

## 2020-06-07 DIAGNOSIS — W19XXXA Unspecified fall, initial encounter: Secondary | ICD-10-CM

## 2020-06-07 DIAGNOSIS — O3660X0 Maternal care for excessive fetal growth, unspecified trimester, not applicable or unspecified: Secondary | ICD-10-CM

## 2020-06-07 DIAGNOSIS — O09299 Supervision of pregnancy with other poor reproductive or obstetric history, unspecified trimester: Secondary | ICD-10-CM

## 2020-06-07 NOTE — Progress Notes (Signed)
Reports she fell yesterday when she ran to try to prevent one child from falling. States she slipped and fell  On her right side on the ground. States when she was trying to get up slipped again on her right side. States belly/ baby did not hit anything.  States felt baby moving when she got up . States baby moving like normal , no bleeding and no change in contractions. Linda,RN    PRENATAL VISIT NOTE  Subjective:  Kathryn Rollins is a 29 y.o. G3P1011 at [redacted]w[redacted]d being seen today for ongoing prenatal care.  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  Patient reports lower back pain (several weeks not associated with fall).  Contractions: Irregular. Vag. Bleeding: None.  Movement: Present. Denies leaking of fluid.   The following portions of the patient's history were reviewed and updated as appropriate: allergies, current medications, past family history, past medical history, past social history, past surgical history and problem list.   Objective:   Vitals:   06/07/20 0957  BP: 116/80  Pulse: (!) 105  Weight: 192 lb 9.6 oz (87.4 kg)    Fetal Status: Fetal Heart Rate (bpm): 140   Movement: Present     General:  Alert, oriented and cooperative. Patient is in no acute distress.  Skin: Skin is warm and dry. No rash noted.   Cardiovascular: Normal heart rate noted  Respiratory: Normal respiratory effort, no problems with respiration noted  Abdomen: Soft, gravid, appropriate for gestational age.  Pain/Pressure: Absent     Pelvic: Cervical exam deferred        Extremities: Normal range of motion.  Edema: None  Mental Status: Normal mood and affect. Normal  behavior. Normal judgment and thought content.   Assessment and Plan:  Pregnancy: G3P1011 at [redacted]w[redacted]d 1. Supervision of high risk pregnancy, antepartum - Fetal nonstress test Baseline:  130 Accelerations:  moderate Decelerations:  absent Variability:  moderate Interpretation:  Reactive NST  2. History of pre-eclampsia in prior pregnancy, currently pregnant BP normal today  3. Fall, initial encounter - Fetal nonstress test  4. Excessive fetal growth affecting management of pregnancy, antepartum, single or unspecified fetus - [place] MFM OB FOLLOW UP; Future  5.  LBP not associated with fall Pregnancy belt   Term labor symptoms and general obstetric precautions including but not limited to vaginal bleeding, contractions, leaking of fluid and fetal movement were reviewed in detail with the patient. Please refer to After Visit Summary for other counseling recommendations.   RTC 1 week MFM [place] to be scheduled  Elsie Lincoln, MD

## 2020-06-07 NOTE — Progress Notes (Signed)
Pt reports food insecurity and was escorted to the [employer]

## 2020-06-15 ENCOUNTER — Ambulatory Visit (INDEPENDENT_AMBULATORY_CARE_PROVIDER_SITE_OTHER): Payer: Medicaid Other | Admitting: Obstetrics and Gynecology

## 2020-06-15 VITALS — BP 118/78 | HR 103 | Wt 192.0 lb

## 2020-06-15 DIAGNOSIS — O09299 Supervision of pregnancy with other poor reproductive or obstetric history, unspecified trimester: Secondary | ICD-10-CM

## 2020-06-15 DIAGNOSIS — Z98891 History of uterine scar from previous surgery: Secondary | ICD-10-CM

## 2020-06-15 DIAGNOSIS — O0990 Supervision of high risk pregnancy, unspecified, unspecified trimester: Secondary | ICD-10-CM

## 2020-06-15 DIAGNOSIS — Z3A38 38 weeks gestation of pregnancy: Secondary | ICD-10-CM

## 2020-06-15 DIAGNOSIS — Z789 Other specified health status: Secondary | ICD-10-CM

## 2020-06-15 NOTE — Progress Notes (Signed)
Prenatal Visit Note Date: 06/15/2020 Clinic: [facility]  Subjective:  Kathryn Rollins is a 29 y.o. G3P1011 at [redacted]w[redacted]d being seen today for ongoing prenatal care.  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  Patient reports continued vaginal discharge. .   Contractions: Irritability. Vag. Bleeding: None.  Movement: Present. Denies leaking of fluid.   The following portions of the patient's history were reviewed and updated as appropriate: allergies, current medications, past family history, past medical history, past social history, past surgical history and problem list. Problem list updated.  Objective:   Vitals:   06/15/20 1542  Weight: 192 lb (87.1 kg)    Fetal Status: Fetal Heart Rate (bpm): 126   Movement: Present     General:  Alert, oriented and cooperative. Patient is in no acute distress.  Skin: Skin is warm and dry. No rash noted.   Cardiovascular: Normal heart rate noted  Respiratory: Normal respiratory effort, no problems with respiration noted  Abdomen: Soft, gravid, appropriate for gestational age. Pain/Pressure: Present     Pelvic:  Cervical exam performed        EGBUS, vaginal vault and cervix visually normal. Negative cough test Cx: 1/long/high  Extremities: Normal range of motion.  Edema: None  Mental Status: Normal mood and affect. Normal behavior. Normal judgment and thought content.   Urinalysis:      Assessment and Plan:  Pregnancy: G3P1011 at [redacted]w[redacted]d  1. Language barrier interpeter used  2. History of cesarean section Desire for tolac. Consent already signed. S/p 2019 pLTCS for NRFHTs. Pt would like IOL  around [facility]. This was d/w her and set up for 12/12 foley bulb IOL  3. History of pre-eclampsia in prior pregnancy, currently pregnant Continue low dose asa  4. Supervision of high risk pregnancy, antepartum  Term labor symptoms and general obstetric precautions including but not limited to vaginal bleeding, contractions, leaking of fluid and fetal movement were reviewed in detail with the patient. Please refer to After Visit Summary for other counseling recommendations.  RTC: Rosalin Hawking, MD

## 2020-06-16 ENCOUNTER — Telehealth (HOSPITAL_COMMUNITY): Payer: Self-pay | Admitting: *Deleted

## 2020-06-16 ENCOUNTER — Encounter (HOSPITAL_COMMUNITY): Payer: Self-pay | Admitting: *Deleted

## 2020-06-16 NOTE — Telephone Encounter (Signed)
Preadmission screen [phone] interpreter number

## 2020-06-18 ENCOUNTER — Other Ambulatory Visit: Payer: Self-pay | Admitting: Advanced Practice Midwife

## 2020-06-19 ENCOUNTER — Encounter: Payer: Self-pay | Admitting: *Deleted

## 2020-06-22 ENCOUNTER — Telehealth (HOSPITAL_COMMUNITY): Payer: Self-pay | Admitting: *Deleted

## 2020-06-22 ENCOUNTER — Encounter: Payer: Medicaid Other | Admitting: Student

## 2020-06-22 ENCOUNTER — Ambulatory Visit (INDEPENDENT_AMBULATORY_CARE_PROVIDER_SITE_OTHER): Payer: Medicaid Other | Admitting: Obstetrics & Gynecology

## 2020-06-22 ENCOUNTER — Other Ambulatory Visit: Payer: Self-pay

## 2020-06-22 VITALS — BP 114/79 | HR 104 | Wt 192.1 lb

## 2020-06-22 DIAGNOSIS — Z98891 History of uterine scar from previous surgery: Secondary | ICD-10-CM

## 2020-06-22 DIAGNOSIS — O0990 Supervision of high risk pregnancy, unspecified, unspecified trimester: Secondary | ICD-10-CM

## 2020-06-22 DIAGNOSIS — Z789 Other specified health status: Secondary | ICD-10-CM

## 2020-06-22 NOTE — Progress Notes (Signed)
Language Resources

## 2020-06-22 NOTE — Telephone Encounter (Signed)
Preadmission screen  

## 2020-06-22 NOTE — Progress Notes (Signed)
   PRENATAL VISIT NOTE  Subjective:  Kathryn Rollins is a 29 y.o. G3P1011 at [redacted]w[redacted]d being seen today for ongoing prenatal care.  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  Patient reports occasional contractions.  Contractions: Irritability. Vag. Bleeding: None.  Movement: Present. Denies leaking of fluid.   The following portions of the patient's history were reviewed and updated as appropriate: allergies, current medications, past family history, past medical history, past social history, past surgical history and problem list.   Objective:   Vitals:   06/22/20 1553  BP: 114/79  Pulse: (!) 104  Weight: 192 lb 1.6 oz (87.1 kg)    Fetal Status: Fetal Heart Rate (bpm): 130   Movement: Present     General:  Alert, oriented and cooperative. Patient is in no acute distress.  Skin: Skin is warm and dry. No rash noted.   Cardiovascular: Normal heart rate noted  Respiratory: Normal respiratory effort, no problems with respiration noted  Abdomen: Soft, gravid, appropriate for gestational age.  Pain/Pressure: Present     Pelvic: Cervical exam performed in the presence of a chaperone        Extremities: Normal range of motion.  Edema: None  Mental Status: Normal mood and affect. Normal behavior. Normal judgment and thought content.   Assessment and Plan:  Pregnancy: G3P1011 at [redacted]w[redacted]d 1. Language barrier [place] speaking  2. History of cesarean section Desires TOLAC, IOL previously scheduled for 12/12 desires to change date to help with change of spontaneous labor, discussed primose oil, dates to help with cervical ripening.   3. Supervision of high risk  pregnancy, antepartum   Term labor symptoms and general obstetric precautions including but not limited to vaginal bleeding, contractions, leaking of fluid and fetal movement were reviewed in detail with the patient. Please refer to After Visit Summary for other counseling recommendations.   No follow-ups on file.  Future Appointments  Date Time Provider Department Center  06/23/2020  9:40 AM [facility]-SCREENING [facility] None  06/25/2020  8:55 AM MC-LD SCHED ROOM [facility]IN[facility] None    Malachy Chamber, MD

## 2020-06-22 NOTE — Progress Notes (Signed)
Reschedule IOL for 07/02/20 in the am.  Pt notified.   Addison Naegeli, RN  06/22/20

## 2020-06-23 ENCOUNTER — Other Ambulatory Visit (HOSPITAL_COMMUNITY): Payer: Medicaid Other

## 2020-06-24 ENCOUNTER — Inpatient Hospital Stay (HOSPITAL_COMMUNITY): Payer: Medicaid Other | Admitting: Anesthesiology

## 2020-06-24 ENCOUNTER — Encounter (HOSPITAL_COMMUNITY): Payer: Self-pay | Admitting: Obstetrics and Gynecology

## 2020-06-24 ENCOUNTER — Inpatient Hospital Stay (HOSPITAL_COMMUNITY)
Admission: AD | Admit: 2020-06-24 | Discharge: 2020-06-27 | DRG: 787 | Disposition: A | Discharge status: Home / Self Care | Payer: Medicaid Other | Attending: Obstetrics and Gynecology | Admitting: Obstetrics and Gynecology

## 2020-06-24 ENCOUNTER — Other Ambulatory Visit: Payer: Self-pay

## 2020-06-24 DIAGNOSIS — D563 Thalassemia minor: Secondary | ICD-10-CM | POA: Diagnosis present

## 2020-06-24 DIAGNOSIS — O99284 Endocrine, nutritional and metabolic diseases complicating childbirth: Secondary | ICD-10-CM | POA: Diagnosis present

## 2020-06-24 DIAGNOSIS — Z20822 Contact with and (suspected) exposure to covid-19: Secondary | ICD-10-CM | POA: Diagnosis present

## 2020-06-24 DIAGNOSIS — O3663X0 Maternal care for excessive fetal growth, third trimester, not applicable or unspecified: Principal | ICD-10-CM | POA: Diagnosis present

## 2020-06-24 DIAGNOSIS — Z3A39 39 weeks gestation of pregnancy: Secondary | ICD-10-CM

## 2020-06-24 DIAGNOSIS — O0990 Supervision of high risk pregnancy, unspecified, unspecified trimester: Secondary | ICD-10-CM

## 2020-06-24 DIAGNOSIS — O34219 Maternal care for unspecified type scar from previous cesarean delivery: Secondary | ICD-10-CM | POA: Diagnosis present

## 2020-06-24 DIAGNOSIS — Z8616 Personal history of COVID-19: Secondary | ICD-10-CM | POA: Diagnosis not present

## 2020-06-24 DIAGNOSIS — E7872 Smith-Lemli-Opitz syndrome: Secondary | ICD-10-CM | POA: Diagnosis present

## 2020-06-24 DIAGNOSIS — L910 Hypertrophic scar: Secondary | ICD-10-CM | POA: Diagnosis present

## 2020-06-24 DIAGNOSIS — Z3A40 40 weeks gestation of pregnancy: Secondary | ICD-10-CM | POA: Diagnosis not present

## 2020-06-24 DIAGNOSIS — R7871 Abnormal lead level in blood: Secondary | ICD-10-CM | POA: Diagnosis present

## 2020-06-24 DIAGNOSIS — Z98891 History of uterine scar from previous surgery: Secondary | ICD-10-CM

## 2020-06-24 DIAGNOSIS — Z789 Other specified health status: Secondary | ICD-10-CM | POA: Diagnosis present

## 2020-06-24 DIAGNOSIS — O9972 Diseases of the skin and subcutaneous tissue complicating childbirth: Secondary | ICD-10-CM | POA: Diagnosis present

## 2020-06-24 DIAGNOSIS — O09299 Supervision of pregnancy with other poor reproductive or obstetric history, unspecified trimester: Secondary | ICD-10-CM

## 2020-06-24 DIAGNOSIS — O34211 Maternal care for low transverse scar from previous cesarean delivery: Secondary | ICD-10-CM | POA: Diagnosis present

## 2020-06-24 DIAGNOSIS — O3660X0 Maternal care for excessive fetal growth, unspecified trimester, not applicable or unspecified: Secondary | ICD-10-CM | POA: Diagnosis present

## 2020-06-24 DIAGNOSIS — O26893 Other specified pregnancy related conditions, third trimester: Secondary | ICD-10-CM | POA: Diagnosis present

## 2020-06-24 DIAGNOSIS — Z148 Genetic carrier of other disease: Secondary | ICD-10-CM

## 2020-06-24 LAB — URINALYSIS, ROUTINE W REFLEX MICROSCOPIC
Bilirubin Urine: NEGATIVE
Glucose, UA: NEGATIVE mg/dL
Hgb urine dipstick: NEGATIVE
Ketones, ur: NEGATIVE mg/dL
Leukocytes,Ua: NEGATIVE
Nitrite: NEGATIVE
Protein, ur: NEGATIVE mg/dL
Specific Gravity, Urine: 1.011 (ref 1.005–1.030)
pH: 6 (ref 5.0–8.0)

## 2020-06-24 LAB — CBC
HCT: 36.3 % (ref 36.0–46.0)
Hemoglobin: 12.3 g/dL (ref 12.0–15.0)
MCH: 31.3 pg (ref 26.0–34.0)
MCHC: 33.9 g/dL (ref 30.0–36.0)
MCV: 92.4 fL (ref 80.0–100.0)
Platelets: 186 10*3/uL (ref 150–400)
RBC: 3.93 MIL/uL (ref 3.87–5.11)
RDW: 13.2 % (ref 11.5–15.5)
WBC: 6.5 10*3/uL (ref 4.0–10.5)
nRBC: 0 % (ref 0.0–0.2)

## 2020-06-24 LAB — TYPE AND SCREEN
ABO/RH(D): B POS
Antibody Screen: NEGATIVE

## 2020-06-24 LAB — RPR: RPR Ser Ql: NONREACTIVE

## 2020-06-24 LAB — RESP PANEL BY RT-PCR (FLU A&B, COVID) ARPGX2
Influenza A by PCR: NEGATIVE
Influenza B by PCR: NEGATIVE
SARS Coronavirus 2 by RT PCR: NEGATIVE

## 2020-06-24 MED ORDER — LIDOCAINE HCL (PF) 1 % IJ SOLN: 30.0000 mL | INTRAMUSCULAR | Status: DC | PRN

## 2020-06-24 MED ORDER — OXYTOCIN BOLUS FROM INFUSION: 333.0000 mL | Freq: Once | INTRAVENOUS | Status: DC

## 2020-06-24 MED ORDER — ONDANSETRON HCL 4 MG/2ML IJ SOLN: 4.0000 mg | Freq: Four times a day (QID) | INTRAMUSCULAR | Status: DC | PRN

## 2020-06-24 MED ORDER — ACETAMINOPHEN 325 MG PO TABS: 650.0000 mg | ORAL_TABLET | ORAL | Status: DC | PRN

## 2020-06-24 MED ORDER — FENTANYL-BUPIVACAINE-NACL 0.5-0.125-0.9 MG/250ML-% EP SOLN
12.0000 mL/h | EPIDURAL | Status: DC | PRN
  Filled 2020-06-24: qty 250

## 2020-06-24 MED ORDER — FENTANYL-BUPIVACAINE-NACL 0.5-0.125-0.9 MG/250ML-% EP SOLN: 12.0000 mL/h | EPIDURAL | Status: DC | PRN

## 2020-06-24 MED ORDER — LIDOCAINE HCL (PF) 1 % IJ SOLN
INTRAMUSCULAR | Status: DC | PRN
  Administered 2020-06-24: 10 mL via EPIDURAL

## 2020-06-24 MED ORDER — EPHEDRINE 5 MG/ML INJ: 10.0000 mg | INTRAVENOUS | Status: DC | PRN

## 2020-06-24 MED ORDER — PHENYLEPHRINE 40 MCG/ML (10ML) SYRINGE FOR IV PUSH (FOR BLOOD PRESSURE SUPPORT): 80.0000 ug | PREFILLED_SYRINGE | INTRAVENOUS | Status: DC | PRN

## 2020-06-24 MED ORDER — PHENYLEPHRINE 40 MCG/ML (10ML) SYRINGE FOR IV PUSH (FOR BLOOD PRESSURE SUPPORT)
80.0000 ug | PREFILLED_SYRINGE | INTRAVENOUS | Status: DC | PRN
  Filled 2020-06-24: qty 10

## 2020-06-24 MED ORDER — SODIUM CHLORIDE 0.9 % IV SOLN
12.0000 mL/h | INTRAVENOUS | Status: DC
  Filled 2020-06-24 (×3): qty 17

## 2020-06-24 MED ORDER — FENTANYL CITRATE (PF) 100 MCG/2ML IJ SOLN
100.0000 ug | INTRAMUSCULAR | Status: DC | PRN
  Administered 2020-06-24 (×2): 100 ug via INTRAVENOUS
  Filled 2020-06-24 (×2): qty 2

## 2020-06-24 MED ORDER — TERBUTALINE SULFATE 1 MG/ML IJ SOLN: 0.2500 mg | Freq: Once | INTRAMUSCULAR | Status: DC | PRN

## 2020-06-24 MED ORDER — DIPHENHYDRAMINE HCL 50 MG/ML IJ SOLN: 12.5000 mg | INTRAMUSCULAR | Status: DC | PRN

## 2020-06-24 MED ORDER — LACTATED RINGERS IV SOLN: 500.0000 mL | Freq: Once | INTRAVENOUS | Status: DC

## 2020-06-24 MED ORDER — LACTATED RINGERS IV SOLN
500.0000 mL | INTRAVENOUS | Status: DC | PRN
  Administered 2020-06-25 (×2): 500 mL via INTRAVENOUS

## 2020-06-24 MED ORDER — FENTANYL CITRATE (PF) 100 MCG/2ML IJ SOLN
50.0000 ug | INTRAMUSCULAR | Status: DC | PRN
  Filled 2020-06-24: qty 2

## 2020-06-24 MED ORDER — SODIUM CHLORIDE (PF) 0.9 % IJ SOLN
INTRAMUSCULAR | Status: DC | PRN
  Administered 2020-06-24: 12 mL/h via EPIDURAL

## 2020-06-24 MED ORDER — OXYTOCIN-SODIUM CHLORIDE 30-0.9 UT/500ML-% IV SOLN
1.0000 m[IU]/min | INTRAVENOUS | Status: DC
  Administered 2020-06-24: 15:00:00 2 m[IU]/min via INTRAVENOUS

## 2020-06-24 MED ORDER — LACTATED RINGERS IV SOLN
INTRAVENOUS | Status: DC
  Administered 2020-06-24: 16:00:00 125 mL/h via INTRAVENOUS

## 2020-06-24 MED ORDER — SOD CITRATE-CITRIC ACID 500-334 MG/5ML PO SOLN
30.0000 mL | ORAL | Status: DC | PRN
  Administered 2020-06-25 (×2): 30 mL via ORAL
  Filled 2020-06-24 (×2): qty 15

## 2020-06-24 MED ORDER — OXYTOCIN-SODIUM CHLORIDE 30-0.9 UT/500ML-% IV SOLN
2.5000 [IU]/h | INTRAVENOUS | Status: DC
  Filled 2020-06-24: qty 500

## 2020-06-24 NOTE — Anesthesia Procedure Notes (Signed)
Epidural Patient location during procedure: OB Start time: 06/24/2020 9:30 PM End time: 06/24/2020 9:34 PM  Staffing Anesthesiologist: Leilani Able, MD Performed: anesthesiologist   Preanesthetic Checklist Completed: patient identified, IV checked, site marked, risks and benefits discussed, surgical consent, monitors and equipment checked, pre-op evaluation and timeout performed  Epidural Patient position: sitting Prep: DuraPrep and site prepped and draped Patient monitoring: continuous pulse ox and blood pressure Approach: midline Location: L3-L4 Injection technique: LOR air  Needle:  Needle type: Tuohy  Needle gauge: 17 G Needle length: 9 cm and 9 Needle insertion depth: 5 cm cm Catheter type: closed end flexible Catheter size: 19 Gauge Catheter at skin depth: 10 cm Test dose: negative and Other  Assessment Events: blood not aspirated, injection not painful, no injection resistance, no paresthesia and negative IV test  Additional Notes Reason for block:procedure for pain

## 2020-06-24 NOTE — Progress Notes (Addendum)
Labor Progress Note Kathryn Rollins is a 29 y.o. G3P1011 at [redacted]w[redacted]d presented for IOL-TOLAC.  S: Having pain with contractions, but is managing well.   O:  BP 111/75    Pulse 99    Temp 98.7 F (37.1 C) (Oral)    Resp 18    Ht 5' 1.81" (1.57 m)    Wt 88.9 kg    LMP 09/19/2019    SpO2 98%    BMI 36.07 kg/m  EFM: 140/moderate variability/+accels, no decels Toco: q2-5 min  CVE: Dilation: 1 Effacement (%): 50 Station: -2 Presentation: Vertex Exam by:: MD Germaine Pomfret   A&P: 29 y.o. I4P8099 [redacted]w[redacted]d presented for IOL-TOLAC. #IOL/TOLAC: FB risks/benefits discussed with patient and placed@ 1730 without complications. Continue with low dose pitocin. #Pain: PRN #FWB: cat 1  #GBS negative #MOF: breast/bottle #MOC: POPs #Circ: yes  # Language barrier: preferred language [place]. [employer], jewellery used. #hx of pre-eclampsia: prenatal Pre-E labs were wnl. Continue to monitor BP. Asymptomatic. #LGA: SD precautions at delivery    Doylene Canard, Medical Student 5:30 PM  GME ATTESTATION:  I saw and evaluated the patient. I agree with the findings and the plan of care as documented in the residents note.  Alric Seton, MD OB Fellow, Faculty [facility], [facility] 06/24/2020 6:03 PM

## 2020-06-24 NOTE — Anesthesia Preprocedure Evaluation (Addendum)
Anesthesia Evaluation  Patient identified by MRN, date of birth, ID band Patient awake    Reviewed: Allergy & Precautions, H&P , NPO status , Patient's Chart, lab work & pertinent test results  Airway Mallampati: II       Dental no notable dental hx.    Pulmonary neg pulmonary ROS,    Pulmonary exam normal        Cardiovascular hypertension, negative cardio ROS Normal cardiovascular exam     Neuro/Psych negative neurological ROS  negative psych ROS   GI/Hepatic negative GI ROS, Neg liver ROS,   Endo/Other  negative endocrine ROS  Renal/GU negative Renal ROS  negative genitourinary   Musculoskeletal negative musculoskeletal ROS (+)   Abdominal (+) + obese,   Peds  Hematology negative hematology ROS (+)   Anesthesia Other Findings   Reproductive/Obstetrics (+) Pregnancy                             Anesthesia Physical Anesthesia Plan  ASA: II and emergent  Anesthesia Plan: Epidural   Post-op Pain Management:    Induction:   PONV Risk Score and Plan: 2 and Ondansetron, Dexamethasone and Scopolamine patch - Pre-op  Airway Management Planned:   Additional Equipment:   Intra-op Plan:   Post-operative Plan:   Informed Consent: I have reviewed the patients History and Physical, chart, labs and discussed the procedure including the risks, benefits and alternatives for the proposed anesthesia with the patient or authorized representative who has indicated his/her understanding and acceptance.       Plan Discussed with:   Anesthesia Plan Comments: (Epidural to C-section.)       Anesthesia Quick Evaluation

## 2020-06-24 NOTE — MAU Note (Signed)
Patient presented to MAU at [redacted]w[redacted]d gestation with c/o contractions and pain at her old c/s incision site since midnight today. Patient reports having 3 episodes of diarrhea today as well. Patient denies having any vaginal bleeding or LOF, patient does feel fetal movement. Patient's primary language is [place], a [place] audio interpreter was obtained for her.

## 2020-06-24 NOTE — MAU Provider Note (Addendum)
Event Date/Time  First Provider Initiated Contact with Patient 06/24/20 [id number]     S: [name] is a 29 y.o. G3P1011 at [redacted]w[redacted]d  who presents to MAU today complaining of contractions. On initial assessment patient states she's having incisional pain from her previous cesarean section and upper left quadrant pain. Patient reports she had 3 pasty bowel movements which resolved her abdominal pain. She states she's only had milk in the past 24 hours. She denies vaginal bleeding. She denies LOF. She reports normal fetal movement.  Patient reports she still desires to [facility].   O: BP 122/81 (BP Location: Right Arm)   Pulse (!) 106   Temp 98.4 F (36.9 C) (Oral)   Resp 18   Wt 89 kg   LMP 09/19/2019   SpO2 98%   BMI 37.07 kg/m  GENERAL: Well-developed, well-nourished female in no acute distress.  HEAD: Normocephalic, atraumatic.  CHEST: Normal effort of breathing, regular heart rate ABDOMEN: Soft, nontender, gravid  Cervical exam:  Dilation: 1 Effacement (%): 50 Exam by:: Johnanna Schneiders RN   Fetal Monitoring: Baseline: 140 Variability: minimal to moderate  Accelerations: present Decelerations: absent Contractions: q 6 mins   A: SIUP at [redacted]w[redacted]d  FHR tracing with minimal variability  P: UA Admit for labor - TOLAC   Sande Rives, Student-MidWife 06/24/2020 9:22 AM   Attestation of Supervision of Student:  I confirm that I have verified the information documented in the nurse midwife student's note and that I have also personally reconciled the history, physical exam and all medical decision making activities.  I have verified that all services and findings are accurately documented in this student's note; and I agree with management and plan as outlined in the documentation. I have also made any necessary editorial changes.  --Reassuring reactivity following apple juice and repositioning to lateral --Return of minimal to moderate variability in setting of GA of [redacted]w[redacted]d and  plan to [facility] --Discussed with L&D team, who agree with recommendation to admit --Remote interpreter utilized for all patient interaction  Calvert Cantor, CNM Center for [employer], [facility] 06/24/2020 11:30 AM

## 2020-06-24 NOTE — Progress Notes (Signed)
Labor Progress Note Kathryn Rollins is a 29 y.o. G3P1011 at [redacted]w[redacted]d presented for IOL-TOLAC.  S: getting epidural   O:  BP 117/82   Pulse (!) 106   Temp 98.6 F (37 C) (Oral)   Resp 16   Ht 5' 1.81" (1.57 m)   Wt 88.9 kg   LMP 09/19/2019   SpO2 98%   BMI 36.07 kg/m  EFM: 140/moderate variability/+accels, no decels Toco: q2 min  CVE: Dilation: 1 Effacement (%): 50 Station: -2 Presentation: Vertex Exam by:: MD Germaine Pomfret   A&P: 29 y.o. N5A2130 [redacted]w[redacted]d presented for IOL-TOLAC. #IOL/TOLAC: Low dose pitocin started at 1430, FB placed at 1730.   #Pain: epidural placed at this time  #FWB: cat 1  #GBS negative #MOF: breast/bottle #MOC: POPs #Circ: yes  # Language barrier: preferred language [place]. Has had multiple discussions with multiple providers regarding induction process and TOLAC process. [employer]er, jewellery used. #hx of pre-eclampsia: prenatal Pre-E labs were wnl. Continue to monitor BP. Asymptomatic. #LGA: SD precautions at delivery     Gita Kudo, MD 9:45 PM

## 2020-06-24 NOTE — Progress Notes (Signed)
Pt seemed unsure of plan for TOLAC. Kathryn Rollins audio interpreter called, MD Germaine Pomfret at bedside answering all of patient and husband's questions and concerns regarding TOLAC. Pt verbalized understanding and gave consent to continue with induction.

## 2020-06-24 NOTE — H&P (Addendum)
OBSTETRIC ADMISSION HISTORY AND PHYSICAL  Kathryn Rollins is a 29 y.o. female G3P1011 with IUP at 8w6dby LMP presented to the MAU with contractions. She reports +FMs, No LOF, no VB, no blurry vision, headaches or peripheral edema, and RUQ pain.  She plans on breast and bottle feeding. She request POPs for birth control. [facility]  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 COVID-19 affecting pregnancy in second trimester 02/16/2020   Unvaccinated. Had fever and cough 02/07/20 and tested positive on 02/10/20.  Doing well as of 02/16/20.    .Marland KitchenElevated blood lead level   . Preeclampsia   . Tooth pain     Past Surgical History: Past Surgical History:  Procedure Laterality Date  . CESAREAN SECTION      Obstetrical History: OB History    Gravida  3   Para  1   Term  1   Preterm      AB  1   Living  1     SAB      IAB      Ectopic      Multiple      Live Births  1           Social History Social History   Socioeconomic History  . Marital status: Married    Spouse name: Not on file  . Number of children: Not on file  . Years of education: Not on file  . Highest education level: Not on file  Occupational History  . Not on file  Tobacco Use  . Smoking status: Never Smoker  . Smokeless tobacco: Never Used  Vaping Use  . Vaping Use: Never used  Substance and Sexual Activity  . Alcohol use: Never  . Drug use: Never  . Sexual activity: Yes    Birth control/protection: None  Other Topics Concern  . Not on file  Social  History Narrative  . Not on file   Social Determinants of Health   Financial Resource Strain: Not on file  Food Insecurity: Food Insecurity Present  . Worried About RCharity fundraiserin the Last Year: Sometimes true  . Ran Out of Food in the Last Year: Sometimes true  Transportation Needs: No Transportation Needs  . Lack of Transportation (Medical): No  . Lack of Transportation (Non-Medical): No  Physical Activity: Not on file  Stress: Not on file  Social Connections: Not on file    Family History: History reviewed. No pertinent family history.  Allergies: Allergies  Allergen Reactions  . Other     Medications for malaria cause itching.  Uncertain of names of medications    Medications Prior to Admission  Medication Sig Dispense Refill Last Dose  . aspirin EC 81 MG tablet Take 1 tablet (81 mg total) by mouth daily. Take after 12 weeks for prevention of preeclampsia later in pregnancy 300 tablet 2 06/23/2020 at Unknown time  . Prenatal Vit-Fe Fumarate-FA (PREPLUS) 27-1 MG TABS Take 1 tablet by mouth daily. 30 tablet 13 06/23/2020 at Unknown time  . acetaminophen (TYLENOL) 500  MG tablet Take 500 mg by mouth every 6 (six) hours as needed for mild pain. (Patient not taking: No sig reported)     . Blood Pressure Monitoring (BLOOD PRESSURE KIT) DEVI 1 Device by Does not apply route as needed. 1 each 0      Review of Systems   All systems reviewed and negative except as stated in HPI  Blood pressure 122/81, pulse (!) 106, temperature 98.4 F (36.9 C), temperature source Oral, resp. rate 18, weight 89 kg, last menstrual period 09/19/2019, SpO2 98 %. General appearance: alert and cooperative Lungs: no respiratory distress  Heart: regular rate  Abdomen: soft, gravid abdomen  Pelvic: stated as above Extremities:  no sign of DVT Presentation: cephalic confirmed by SVE by Dr. Sylvester Harder Fetal monitoringBaseline: 140 bpm, Variability: Good {> 6 bpm) and Accelerations:  Reactive Uterine activity: contractions q5-6 minutes Dilation: 1 Effacement (%): 50 Station: -2,Ballotable Exam by:: MD Jake Michaelis labs: ABO, Rh: B/Positive/-- (06/01 0944) Antibody: Negative (06/01 0944) Rubella: 9.21 (06/01 0944) RPR: Non Reactive (09/22 0849)  HBsAg: Negative (06/01 0944)  HIV: Non Reactive (09/22 0849)  GBS: Negative/-- (11/18 1136)  2hr GTT: normal  Genetic screening : silent alpha thalassemia carrier/ SLO carrier Anatomy US: normal except LGA  Prenatal Transfer Tool  Maternal Diabetes: No Genetic Screening: Abnormal:  Results: Other: silent alpha thalassemia carrier/ SLO carrier Maternal Ultrasounds/Referrals: Normal Fetal Ultrasounds or other Referrals:  None Maternal Substance Abuse:  No Significant Maternal Medications:  None Significant Maternal Lab Results: None  Results for orders placed or performed during the hospital encounter of 06/24/20 (from the past 24 hour(s))  Urinalysis, Routine w reflex microscopic Urine, Clean Catch   Collection Time: 06/24/20  9:43 AM  Result Value Ref Range   Color, Urine YELLOW YELLOW   APPearance CLEAR CLEAR   Specific Gravity, Urine 1.011 1.005 - 1.030   pH 6.0 5.0 - 8.0   Glucose, UA NEGATIVE NEGATIVE mg/dL   Hgb urine dipstick NEGATIVE NEGATIVE   Bilirubin Urine NEGATIVE NEGATIVE   Ketones, ur NEGATIVE NEGATIVE mg/dL   Protein, ur NEGATIVE NEGATIVE mg/dL   Nitrite NEGATIVE NEGATIVE   Leukocytes,Ua NEGATIVE NEGATIVE  Resp Panel by RT-PCR (Flu A&B, Covid) Nasopharyngeal Swab   Collection Time: 06/24/20 10:28 AM   Specimen: Nasopharyngeal Swab; Nasopharyngeal(NP) swabs in vial transport medium  Result Value Ref Range   SARS Coronavirus 2 by RT PCR NEGATIVE NEGATIVE   Influenza A by PCR NEGATIVE NEGATIVE   Influenza B by PCR NEGATIVE NEGATIVE  CBC   Collection Time: 06/24/20 10:28 AM  Result Value Ref Range   WBC 6.5 4.0 - 10.5 K/uL   RBC 3.93 3.87 - 5.11 MIL/uL   Hemoglobin 12.3 12.0 - 15.0  g/dL   HCT 36.3 36.0 - 46.0 %   MCV 92.4 80.0 - 100.0 fL   MCH 31.3 26.0 - 34.0 pg   MCHC 33.9 30.0 - 36.0 g/dL   RDW 13.2 11.5 - 15.5 %   Platelets 186 150 - 400 K/uL   nRBC 0.0 0.0 - 0.2 %  Type and screen   Collection Time: 06/24/20 10:35 AM  Result Value Ref Range   ABO/RH(D) B POS    Antibody Screen NEG    Sample Expiration      06/27/2020,2359 Performed at [facility], [place]. [address] [place],  [address]     Patient Active Problem List   Diagnosis Date Noted  . History of cesarean delivery, currently pregnant 06/24/2020  .  LGA (large for gestational age) fetus affecting management of mother 06/24/2020  . Alpha thalassemia silent carrier 01/14/2020  . 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 History of cesarean section 12/14/2019  . Supervision of high risk pregnancy, antepartum 12/06/2019  . History of pre-eclampsia in prior pregnancy, currently pregnant 12/06/2019  . Language barrier 12/06/2019  . Elevated blood lead level     Assessment/Plan:   Jaela Yepez is a 29 y.o. G3P1011 at 49w6dwith hx of c/s that presented to the MAU with contractions and was found to have variable deceleration x1 with NST. Planned for IOL on 06/25/20. After discussion with patient, decided to induce labor today and TOLAC.   # IOL/TOLAC with low dose pitocin. Will discuss placing FB at next check. TOLAC risks/benefits discussed with patient, TOLAC calculator ~56%. #Pain: PRN   #FWB:  cat 1 #ID: GBS neg #MOF: breast/bottle #MOC: POPs #Circ: yes  # Language barrier: preferred language [place] [employer] IOptician, dispensingused. #hx of pre-eclampsia: prenatal Pre-E labs were wnl. Continue to monitor BP. Asymptomatic. #LGA: SD precautions at delivery   ATheodis Sato Medical Student  06/24/2020, 10:26 AM  GME ATTESTATION:  I saw and evaluated the patient. I agree with the findings and the plan of care as documented in the resident's note.  AArrie Senate MD OB Fellow, [place]12/05/2020 1:29 PM

## 2020-06-25 ENCOUNTER — Encounter (HOSPITAL_COMMUNITY): Payer: Self-pay | Admitting: Obstetrics and Gynecology

## 2020-06-25 ENCOUNTER — Other Ambulatory Visit: Payer: Self-pay | Admitting: Obstetrics and Gynecology

## 2020-06-25 ENCOUNTER — Encounter (HOSPITAL_COMMUNITY)
Admission: AD | Disposition: A | Discharge status: Home / Self Care | Payer: Self-pay | Source: Home / Self Care | Attending: Obstetrics and Gynecology

## 2020-06-25 ENCOUNTER — Inpatient Hospital Stay (HOSPITAL_COMMUNITY): Payer: Medicaid Other

## 2020-06-25 DIAGNOSIS — O34211 Maternal care for low transverse scar from previous cesarean delivery: Secondary | ICD-10-CM

## 2020-06-25 DIAGNOSIS — Z3A40 40 weeks gestation of pregnancy: Secondary | ICD-10-CM

## 2020-06-25 SURGERY — Surgical Case
Anesthesia: Epidural

## 2020-06-25 MED ORDER — SENNOSIDES-DOCUSATE SODIUM 8.6-50 MG PO TABS
2.0000 | ORAL_TABLET | Freq: Every evening | ORAL | Status: DC | PRN
  Administered 2020-06-26: 2 via ORAL
  Filled 2020-06-25: qty 2

## 2020-06-25 MED ORDER — ONDANSETRON HCL 4 MG/2ML IJ SOLN
4.0000 mg | Freq: Three times a day (TID) | INTRAMUSCULAR | Status: DC | PRN
  Administered 2020-06-25: 18:00:00 4 mg via INTRAVENOUS
  Filled 2020-06-25: qty 2

## 2020-06-25 MED ORDER — OXYCODONE HCL 5 MG PO TABS: 5.0000 mg | ORAL_TABLET | Freq: Four times a day (QID) | ORAL | Status: DC | PRN

## 2020-06-25 MED ORDER — ACETAMINOPHEN 500 MG PO TABS
1000.0000 mg | ORAL_TABLET | Freq: Once | ORAL | Status: AC
  Administered 2020-06-25: 05:00:00 1000 mg via ORAL
  Filled 2020-06-25: qty 2

## 2020-06-25 MED ORDER — LIDOCAINE HCL (PF) 2 % IJ SOLN
INTRAMUSCULAR | Status: AC
  Filled 2020-06-25: qty 5

## 2020-06-25 MED ORDER — DIPHENHYDRAMINE HCL 25 MG PO CAPS: 25.0000 mg | ORAL_CAPSULE | ORAL | Status: DC | PRN

## 2020-06-25 MED ORDER — EPINEPHRINE PF 1 MG/ML IJ SOLN
INTRAMUSCULAR | Status: AC
  Filled 2020-06-25: qty 1

## 2020-06-25 MED ORDER — NALOXONE HCL 0.4 MG/ML IJ SOLN: 0.4000 mg | INTRAMUSCULAR | Status: DC | PRN

## 2020-06-25 MED ORDER — NALBUPHINE HCL 10 MG/ML IJ SOLN: 5.0000 mg | Freq: Once | INTRAMUSCULAR | Status: DC | PRN

## 2020-06-25 MED ORDER — DIPHENHYDRAMINE HCL 25 MG PO CAPS
25.0000 mg | ORAL_CAPSULE | Freq: Four times a day (QID) | ORAL | Status: DC | PRN
Start: 2020-06-25 — End: 2020-06-27

## 2020-06-25 MED ORDER — SODIUM CHLORIDE 0.9 % IV SOLN
INTRAVENOUS | Status: AC
  Filled 2020-06-25: qty 500

## 2020-06-25 MED ORDER — LIDOCAINE HCL (PF) 2 % IJ SOLN
INTRAMUSCULAR | Status: AC
  Filled 2020-06-25: qty 20

## 2020-06-25 MED ORDER — SODIUM BICARBONATE 8.4 % IV SOLN
INTRAVENOUS | Status: DC | PRN
  Administered 2020-06-25: 10 mL via EPIDURAL

## 2020-06-25 MED ORDER — SODIUM CHLORIDE 0.9% FLUSH: 3.0000 mL | INTRAVENOUS | Status: DC | PRN

## 2020-06-25 MED ORDER — CEFAZOLIN SODIUM-DEXTROSE 2-4 GM/100ML-% IV SOLN
2.0000 g | INTRAVENOUS | Status: AC
  Administered 2020-06-25: 13:00:00 2 g via INTRAVENOUS

## 2020-06-25 MED ORDER — NALOXONE HCL 4 MG/10ML IJ SOLN
1.0000 ug/kg/h | INTRAVENOUS | Status: DC | PRN
  Filled 2020-06-25: qty 5

## 2020-06-25 MED ORDER — MORPHINE SULFATE (PF) 0.5 MG/ML IJ SOLN
INTRAMUSCULAR | Status: AC
  Filled 2020-06-25: qty 10

## 2020-06-25 MED ORDER — SCOPOLAMINE 1 MG/3DAYS TD PT72
1.0000 | MEDICATED_PATCH | Freq: Once | TRANSDERMAL | Status: DC
  Administered 2020-06-25: 21:00:00 1.5 mg via TRANSDERMAL
  Filled 2020-06-25: qty 1

## 2020-06-25 MED ORDER — LIDOCAINE-EPINEPHRINE (PF) 2 %-1:200000 IJ SOLN
INTRAMUSCULAR | Status: DC | PRN
  Administered 2020-06-25: 5 mL via EPIDURAL

## 2020-06-25 MED ORDER — OXYTOCIN-SODIUM CHLORIDE 30-0.9 UT/500ML-% IV SOLN
INTRAVENOUS | Status: AC
  Filled 2020-06-25: qty 500

## 2020-06-25 MED ORDER — IBUPROFEN 800 MG PO TABS: 800.0000 mg | ORAL_TABLET | Freq: Four times a day (QID) | ORAL | Status: DC | PRN

## 2020-06-25 MED ORDER — MEPERIDINE HCL 25 MG/ML IJ SOLN: 6.2500 mg | INTRAMUSCULAR | Status: DC | PRN

## 2020-06-25 MED ORDER — COCONUT OIL OIL
1.0000 "application " | TOPICAL_OIL | Status: DC | PRN
  Administered 2020-06-27: 1 via TOPICAL

## 2020-06-25 MED ORDER — OXYTOCIN-SODIUM CHLORIDE 30-0.9 UT/500ML-% IV SOLN
INTRAVENOUS | Status: DC | PRN
  Administered 2020-06-25: 300 mL via INTRAVENOUS

## 2020-06-25 MED ORDER — FENTANYL CITRATE (PF) 100 MCG/2ML IJ SOLN
INTRAMUSCULAR | Status: DC | PRN
  Administered 2020-06-25: 100 ug via INTRAVENOUS

## 2020-06-25 MED ORDER — TETANUS-DIPHTH-ACELL PERTUSSIS 5-2.5-18.5 LF-MCG/0.5 IM SUSY: 0.5000 mL | PREFILLED_SYRINGE | Freq: Once | INTRAMUSCULAR | Status: DC

## 2020-06-25 MED ORDER — PHENYLEPHRINE HCL-NACL 20-0.9 MG/250ML-% IV SOLN
INTRAVENOUS | Status: AC
  Filled 2020-06-25: qty 250

## 2020-06-25 MED ORDER — NALBUPHINE HCL 10 MG/ML IJ SOLN
5.0000 mg | INTRAMUSCULAR | Status: DC | PRN
Start: 2020-06-25 — End: 2020-06-27
  Administered 2020-06-26: 5 mg via INTRAVENOUS
  Filled 2020-06-25: qty 1

## 2020-06-25 MED ORDER — LIDOCAINE 5 % EX PTCH
1.0000 | MEDICATED_PATCH | CUTANEOUS | Status: DC
  Administered 2020-06-25: 06:00:00 1 via TRANSDERMAL
  Filled 2020-06-25: qty 1

## 2020-06-25 MED ORDER — SODIUM CHLORIDE 0.9 % IV SOLN
500.0000 mg | Freq: Once | INTRAVENOUS | Status: AC
  Administered 2020-06-25: 14:00:00 500 mg via INTRAVENOUS

## 2020-06-25 MED ORDER — SODIUM CHLORIDE 0.9 % IR SOLN
Status: DC | PRN
  Administered 2020-06-25: 1

## 2020-06-25 MED ORDER — LACTATED RINGERS IV SOLN: INTRAVENOUS | Status: DC

## 2020-06-25 MED ORDER — FENTANYL CITRATE (PF) 100 MCG/2ML IJ SOLN: 25.0000 ug | INTRAMUSCULAR | Status: DC | PRN

## 2020-06-25 MED ORDER — POLYETHYLENE GLYCOL 3350 17 G PO PACK
17.0000 g | PACK | Freq: Every day | ORAL | Status: DC
  Administered 2020-06-26 – 2020-06-27 (×2): 17 g via ORAL
  Filled 2020-06-25 (×2): qty 1

## 2020-06-25 MED ORDER — PROMETHAZINE HCL 25 MG/ML IJ SOLN: 6.2500 mg | INTRAMUSCULAR | Status: DC | PRN

## 2020-06-25 MED ORDER — GABAPENTIN 100 MG PO CAPS
100.0000 mg | ORAL_CAPSULE | Freq: Two times a day (BID) | ORAL | Status: DC
  Administered 2020-06-25 – 2020-06-27 (×4): 100 mg via ORAL
  Filled 2020-06-25 (×4): qty 1

## 2020-06-25 MED ORDER — ONDANSETRON HCL 4 MG/2ML IJ SOLN
INTRAMUSCULAR | Status: AC
  Filled 2020-06-25: qty 2

## 2020-06-25 MED ORDER — OXYTOCIN-SODIUM CHLORIDE 30-0.9 UT/500ML-% IV SOLN
2.5000 [IU]/h | INTRAVENOUS | Status: AC
  Administered 2020-06-25: 18:00:00 2.5 [IU]/h via INTRAVENOUS
  Filled 2020-06-25: qty 500

## 2020-06-25 MED ORDER — HYDROXYZINE HCL 50 MG PO TABS
25.0000 mg | ORAL_TABLET | Freq: Three times a day (TID) | ORAL | Status: DC | PRN
  Filled 2020-06-25: qty 1

## 2020-06-25 MED ORDER — FENTANYL CITRATE (PF) 100 MCG/2ML IJ SOLN
INTRAMUSCULAR | Status: DC | PRN
  Administered 2020-06-25: 100 ug via EPIDURAL

## 2020-06-25 MED ORDER — DIBUCAINE (PERIANAL) 1 % EX OINT
1.0000 | TOPICAL_OINTMENT | CUTANEOUS | Status: DC | PRN
Start: 2020-06-25 — End: 2020-06-27

## 2020-06-25 MED ORDER — MORPHINE SULFATE (PF) 0.5 MG/ML IJ SOLN
INTRAMUSCULAR | Status: DC | PRN
  Administered 2020-06-25: 2 mg via EPIDURAL

## 2020-06-25 MED ORDER — STERILE WATER FOR IRRIGATION IR SOLN
Status: DC | PRN
  Administered 2020-06-25: 1

## 2020-06-25 MED ORDER — KETOROLAC TROMETHAMINE 30 MG/ML IJ SOLN: 30.0000 mg | Freq: Four times a day (QID) | INTRAMUSCULAR | Status: AC | PRN

## 2020-06-25 MED ORDER — SIMETHICONE 80 MG PO CHEW
80.0000 mg | CHEWABLE_TABLET | Freq: Three times a day (TID) | ORAL | Status: DC
  Administered 2020-06-26 – 2020-06-27 (×4): 80 mg via ORAL
  Filled 2020-06-25 (×5): qty 1

## 2020-06-25 MED ORDER — WITCH HAZEL-GLYCERIN EX PADS: 1.0000 "application " | MEDICATED_PAD | CUTANEOUS | Status: DC | PRN

## 2020-06-25 MED ORDER — FENTANYL CITRATE (PF) 100 MCG/2ML IJ SOLN
INTRAMUSCULAR | Status: AC
  Filled 2020-06-25: qty 2

## 2020-06-25 MED ORDER — ACETAMINOPHEN 500 MG PO TABS
1000.0000 mg | ORAL_TABLET | Freq: Four times a day (QID) | ORAL | Status: AC
  Administered 2020-06-26 (×3): 1000 mg via ORAL
  Filled 2020-06-25 (×3): qty 2

## 2020-06-25 MED ORDER — KETOROLAC TROMETHAMINE 30 MG/ML IJ SOLN
INTRAMUSCULAR | Status: AC
  Filled 2020-06-25: qty 1

## 2020-06-25 MED ORDER — LACTATED RINGERS IV SOLN: INTRAVENOUS | Status: DC | PRN

## 2020-06-25 MED ORDER — SOD CITRATE-CITRIC ACID 500-334 MG/5ML PO SOLN: 30.0000 mL | ORAL | Status: DC

## 2020-06-25 MED ORDER — KETOROLAC TROMETHAMINE 30 MG/ML IJ SOLN
30.0000 mg | Freq: Four times a day (QID) | INTRAMUSCULAR | Status: AC | PRN
  Administered 2020-06-25: 30 mg via INTRAVENOUS

## 2020-06-25 MED ORDER — DIPHENHYDRAMINE HCL 50 MG/ML IJ SOLN: 12.5000 mg | INTRAMUSCULAR | Status: DC | PRN

## 2020-06-25 MED ORDER — ENOXAPARIN SODIUM 40 MG/0.4ML ~~LOC~~ SOLN
40.0000 mg | SUBCUTANEOUS | Status: DC
  Administered 2020-06-27: 03:00:00 40 mg via SUBCUTANEOUS
  Filled 2020-06-25: qty 0.4

## 2020-06-25 MED ORDER — ONDANSETRON HCL 4 MG/2ML IJ SOLN
INTRAMUSCULAR | Status: DC | PRN
  Administered 2020-06-25: 4 mg via INTRAVENOUS

## 2020-06-25 MED ORDER — MENTHOL 3 MG MT LOZG: 1.0000 | LOZENGE | OROMUCOSAL | Status: DC | PRN

## 2020-06-25 MED ORDER — NALBUPHINE HCL 10 MG/ML IJ SOLN
5.0000 mg | INTRAMUSCULAR | Status: DC | PRN
Start: 2020-06-25 — End: 2020-06-27

## 2020-06-25 MED ORDER — PRENATAL MULTIVITAMIN CH
1.0000 | ORAL_TABLET | Freq: Every day | ORAL | Status: DC
  Administered 2020-06-26 – 2020-06-27 (×2): 1 via ORAL
  Filled 2020-06-25 (×2): qty 1

## 2020-06-25 SURGICAL SUPPLY — 35 items
BENZOIN TINCTURE PRP APPL 2/3 (GAUZE/BANDAGES/DRESSINGS) ×3 IMPLANT
CANISTER SUCT 3000ML PPV (MISCELLANEOUS) ×3 IMPLANT
CHLORAPREP W/TINT 26ML (MISCELLANEOUS) ×3 IMPLANT
CLOSURE STERI-STRIP 1/4X4 (GAUZE/BANDAGES/DRESSINGS) ×3 IMPLANT
CLOSURE WOUND 1/2 X4 (GAUZE/BANDAGES/DRESSINGS) ×1
DRSG OPSITE POSTOP 4X10 (GAUZE/BANDAGES/DRESSINGS) ×3 IMPLANT
ELECT REM PT RETURN 9FT ADLT (ELECTROSURGICAL) ×3
ELECTRODE REM PT RTRN 9FT ADLT (ELECTROSURGICAL) ×1 IMPLANT
EXTRACTOR VACUUM KIWI (MISCELLANEOUS) ×3 IMPLANT
GLOVE BIOGEL PI IND STRL 7.0 (GLOVE) ×2 IMPLANT
GLOVE BIOGEL PI IND STRL 7.5 (GLOVE) ×1 IMPLANT
GLOVE BIOGEL PI INDICATOR 7.0 (GLOVE) ×4
GLOVE BIOGEL PI INDICATOR 7.5 (GLOVE) ×2
GLOVE SKINSENSE NS SZ7.0 (GLOVE) ×2
GLOVE SKINSENSE STRL SZ7.0 (GLOVE) ×1 IMPLANT
GOWN STRL REUS W/ TWL LRG LVL3 (GOWN DISPOSABLE) ×2 IMPLANT
GOWN STRL REUS W/ TWL XL LVL3 (GOWN DISPOSABLE) ×1 IMPLANT
GOWN STRL REUS W/TWL LRG LVL3 (GOWN DISPOSABLE) ×4
GOWN STRL REUS W/TWL XL LVL3 (GOWN DISPOSABLE) ×2
NS IRRIG 1000ML POUR BTL (IV SOLUTION) ×3 IMPLANT
PACK C SECTION WH (CUSTOM PROCEDURE TRAY) ×3 IMPLANT
PAD ABD 7.5X8 STRL (GAUZE/BANDAGES/DRESSINGS) ×3 IMPLANT
PAD OB MATERNITY 4.3X12.25 (PERSONAL CARE ITEMS) ×3 IMPLANT
PAD PREP 24X48 CUFFED NSTRL (MISCELLANEOUS) ×3 IMPLANT
PENCIL SMOKE EVAC W/HOLSTER (ELECTROSURGICAL) ×3 IMPLANT
STRIP CLOSURE SKIN 1/2X4 (GAUZE/BANDAGES/DRESSINGS) ×2 IMPLANT
SUT MNCRL 0 VIOLET CTX 36 (SUTURE) ×2 IMPLANT
SUT MON AB 4-0 PS1 27 (SUTURE) ×3 IMPLANT
SUT MONOCRYL 0 CTX 36 (SUTURE) ×4
SUT PLAIN 2 0 XLH (SUTURE) ×3 IMPLANT
SUT VIC AB 0 CT1 36 (SUTURE) ×6 IMPLANT
SUT VIC AB 3-0 CT1 27 (SUTURE) ×2
SUT VIC AB 3-0 CT1 TAPERPNT 27 (SUTURE) ×1 IMPLANT
TOWEL OR 17X24 6PK STRL BLUE (TOWEL DISPOSABLE) ×6 IMPLANT
WATER STERILE IRR 1000ML POUR (IV SOLUTION) ×3 IMPLANT

## 2020-06-25 NOTE — Lactation Note (Signed)
This note was copied from a baby's chart. Mother requesting formula.  Mother is attempting to breastfeed, states she has no milk and that her nipple is too big for the baby to latch.  Pt declines assistance and insists that she will breastfeed but states the baby is hungry now and needs formula.  Newborn able to latch without assistance, mother continues to ask for formula.  Mother encouraged, and risk benefit of formula vs. breastfeedingdiscussed with mother.   Formula given with education on feeding volumes and preparation.

## 2020-06-25 NOTE — Transfer of Care (Signed)
Immediate Anesthesia Transfer of Care Note  Patient: Kathryn Rollins  Procedure(s) Performed: CESAREAN SECTION (N/A )  Patient Location: PACU  Anesthesia Type:Epidural  Level of Consciousness: awake, alert  and oriented  Airway & Oxygen Therapy: Patient Spontanous Breathing  Post-op Assessment: Report given to RN and Post -op Vital signs reviewed and stable  Post vital signs: Reviewed and stable  Last Vitals:  Vitals Value Taken Time  BP 137/74 06/25/20 1500  Temp    Pulse 99 06/25/20 1505  Resp 32 06/25/20 1505  SpO2 97 % 06/25/20 1505  Vitals shown include unvalidated device data.  Last Pain:  Vitals:   06/25/20 1020  TempSrc: Oral  PainSc:          Complications: No complications documented.

## 2020-06-25 NOTE — Progress Notes (Addendum)
Kathryn Rollins is a 29 y.o. G3P1011 at 102w0d by admitted for induction of labor due to TOLAC, EFW 90% on 28 week [place].  Subjective: Pt complaining of severe upper and lower back pain, heating pad and lidocaine patch throughout the night to help with discomfort.  Epidural effective in covering abdominal contraction pain, PCA bolus used for lower back pain.  Pt remains uncomfortable, moaning after 2 PCA bolus- anesthesia called to come evaluate. Anesthesia bolused epidural- will reassess comfort level.  Discussed plan of care with patient and husband- made aware of no change in cervical exam as well as recurrent late decelerations resolved with decreasing pitocin, fluid bolus, and pitocin change.  Discussed appropriate option of continuing to labor and check progression in 2 hours or go for a c-section.  At this time patient is having a difficult time focus on options and taking in any information, will reevaluate after epidural bolus has had time to take effect.  Dr. Vergie Living made aware of FHR and plan of care, agreable to plan.  Objective: BP 122/85   Pulse (!) 106   Temp 99.4 F (37.4 C) (Oral)   Resp 18   Ht 5' 1.81" (1.57 m)   Wt 88.9 kg   LMP 09/19/2019   SpO2 99%   BMI 36.07 kg/m  I/O last 3 completed shifts: In: -  Out: 300 [Urine:300] Total I/O In: -  Out: 75 [Urine:75]  FHT:  FHR: 135 bpm, variability: moderate,  accelerations:  Abscent,  decelerations:  Present Late UC:   regular, every 1-3 minutes SVE:   Dilation: 6 Effacement (%): 80 Station: -3 Exam by:: CNM V Etha Stambaugh  Labs: Lab Results  Component Value Date   WBC 6.5 06/24/2020   HGB 12.3 06/24/2020   HCT 36.3 06/24/2020   MCV 92.4 06/24/2020   PLT 186 06/24/2020    Assessment / Plan: Protracted active phase  Labor:  No change since first exam of 6/80/-3 at 0530 Preeclampsia:   Hx of preE in previous pregnancy- normotensive in labor, asymptomatic Fetal Wellbeing:  Category II and recurrent late decelerations-  resolved with position change, decreasing pitocin and fluid bolus Pain Control:   Heating pad to upper back, epidural with bolus by anesthesia at 1050  Anticipated MOD:   Plan to evaulate labor progess and patient pain control and wishes at [employer] 06/25/2020, 11:02 AM  Attestation of Supervision of Student:  I confirm that I have verified the information documented in the nurse midwife student's note and that I have also personally performed the history, physical exam and all medical decision making activities.  I have verified that all services and findings are accurately documented in this student's note; and I agree with management and plan as outlined in the documentation. I have also made any necessary editorial changes.  Phone interpreter used but slow to answer and not always able to communicate w/ pt clearly. Husband stepping in to clarify, more helpful. Clarified that pt's preference has been to try for vaginal birth,  but now leaning toward C/S since she is progressing so slowly. Reiterated our desire to do what she is most comfortable and try to manage her pain adequately so that that is not driving her decision. Dr. Vergie Living updated. Asked CNM to recheck at noon and we will determine POC.   Dorathy Kinsman, CNM [facility] [employer], [facility] 06/25/2020 11:54 AM

## 2020-06-25 NOTE — Progress Notes (Addendum)
L&D Note  06/25/2020 - 12:28 PM  29 y.o. G3P1011 [redacted]w[redacted]d. Pregnancy complicated by h/o pLTCS for NRFHT, LGA at last u/s  Patient Active Problem List   Diagnosis Date Noted  . History of cesarean delivery, currently pregnant 06/24/2020  . LGA (large for gestational age) fetus affecting management of mother 06/24/2020  . Alpha thalassemia silent carrier 01/14/2020  . 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 History of cesarean section 12/14/2019  . Supervision of high risk pregnancy, antepartum 12/06/2019  . History of pre-eclampsia in prior pregnancy, currently pregnant 12/06/2019  . Language barrier 12/06/2019  . Elevated blood lead level     [name] is admitted for IOL for pregnancy at [facility] with variable decel during labor eval   Subjective:  Continues to feel pain at the same spot (upper back)  Objective:    Current Vital Signs 24h Vital Sign Ranges  T 99.4 F (37.4 C) Temp  Avg: 98.8 F (37.1 C)  Min: 98.3 F (36.8 C)  Max: 99.7 F (37.6 C)  BP 121/80 BP  Min: 95/74  Max: 133/88  HR (!) 101 Pulse  Avg: 104.6  Min: 97  Max: 156  RR 16 Resp  Avg: 17.1  Min: 16  Max: 20  SaO2 99 %   SpO2  Avg: 99 %  Min: 99 %  Max: 99 %       24 Hour I/O Current Shift I/O  Time Ins Outs 12/11 0701 - 12/12 0700 In: -  Out: 300 [Urine:300] 12/12 0701 - 12/12 1900 In: -  Out: 75 [Urine:75]   FHR: 140 baseline, no accels, rare slight variables, mod variability Toco: q74m Gen: pt appears mild to moderately uncomforable SVE: 6/with cx edema. Moderate mec  Labs:  Recent Labs  Lab 06/24/20 1028  WBC 6.5  HGB 12.3  HCT 36.3  PLT 186   No results for input(s): NA, K, CL, CO2, BUN, CREATININE, CALCIUM, PROT, BILITOT, ALKPHOS, ALT, AST, GLUCOSE in the last 168 hours.  Invalid input(s): LABALBU  Medications Current Facility-Administered Medications  Medication Dose Route Frequency Provider Last Rate Last Admin  . acetaminophen (TYLENOL) tablet 650 mg  650  mg Oral Q4H PRN Cre[place] Bing, MD      . bupivacaine 0.1 % (1 mg/ml) epidural infusion in NS 100 ml (WH-ANES) ( total)  12 mL/hr Epidural Continuous Hatchett, Susann Givens, MD      . diphenhydrAMINE (BENADRYL) injection 12.5 mg  12.5 mg Intravenous Q15 min PRN Hatchett, Susann Givens, MD      . ePHEDrine injection 10 mg  10 mg Intravenous PRN Hatchett, Susann Givens, MD      . ePHEDrine injection 10 mg  10 mg Intravenous PRN Leilani Able, MD      . fentaNYL (SUBLIMAZE) injection 100 mcg  100 mcg Intravenous Q1H PRN Alric Seton, MD   100 mcg at 06/24/20 2051  . fentaNYL 2 mcg/mL w/ bupivacaine 0.125% in NS 250 mL epidural infusion (WCC-ANES)  12 mL/hr Epidural Continuous PRN Hatchett, Susann Givens, MD      . hydrOXYzine (ATARAX/VISTARIL) tablet 25 mg  25 mg Oral TID PRN Gita Kudo, MD      . lactated ringers infusion 500 mL  500 mL Intravenous Once Hatchett, Susann Givens, MD      . lactated ringers infusion 500-1,000 mL  500-1,000 mL Intravenous PRN [place] Hevia Bing, MD 999 mL/hr at 06/25/20 1036 500 mL at 06/25/20 1036  . lactated ringers infusion   Intravenous Continuous [place] Bing, MD  125 mL/hr at 06/24/20 1622 125 mL/hr at 06/24/20 1622  . lidocaine (LIDODERM) 5 % 1 patch  1 patch Transdermal Q24H Gita Kudo, MD   1 patch at 06/25/20 [phone]  . lidocaine (PF) (XYLOCAINE) 1 % injection 30 mL  30 mL Subcutaneous PRN [place] Bing, MD      . ondansetron (ZOFRAN) injection 4 mg  4 mg Intravenous Q6H PRN [place] Bing, MD      . oxytocin (PITOCIN) IV BOLUS FROM BAG  333 mL Intravenous Once [place] Bing, MD      . oxytocin (PITOCIN) IV infusion 30 units in NS 500 mL - Premix  2.5 Units/hr Intravenous Continuous [place] Bing, MD      . oxytocin (PITOCIN) IV infusion 30 units in NS 500 mL - Premix  1-40 milli-units/min Intravenous Titrated Alric Seton, MD   Stopped at 06/25/20 1115  . PHENYLephrine 40 mcg/ml in normal saline Adult IV Push Syringe (For Blood Pressure  Support)  80 mcg Intravenous PRN Hatchett, Susann Givens, MD      . PHENYLephrine 40 mcg/ml in normal saline Adult IV Push Syringe (For Blood Pressure Support)  80 mcg Intravenous PRN Hatchett, Susann Givens, MD      . sodium citrate-citric acid (ORACIT) solution 30 mL  30 mL Oral Q2H PRN [place] Bing, MD   30 mL at 06/25/20 0450  . terbutaline (BRETHINE) injection 0.25 mg  0.25 mg Subcutaneous Once PRN [place] Bing, MD      . terbutaline (BRETHINE) injection 0.25 mg  0.25 mg Subcutaneous Once PRN Alric Seton, MD       Facility-Administered Medications Ordered in Other Encounters  Medication Dose Route Frequency Provider Last Rate Last Admin  . fentaNYL (SUBLIMAZE) injection   Epidural Anesthesia Intra-op Cecile Hearing, MD   100 mcg at 06/25/20 1045  . fentaNYL citrate (PF) (SUBLIMAZE) 500 mcg, bupivacaine HCl (PF) (SENSORCAINE-MPF) 41.67 mL in sodium chloride (PF) 0.9 % 250 mL epidural   Epidural Continuous PRN Leilani Able, MD 12 mL/hr at 06/24/20 2138 12 mL/hr at 06/24/20 2138  . lidocaine (PF) (XYLOCAINE) 1 % injection   Epidural Anesthesia Intra-op Leilani Able, MD   10 mL at 06/24/20 2134  . lidocaine-EPINEPHrine (XYLOCAINE W/EPI) 2 %-1:200000 (PF) injection   Epidural Anesthesia Intra-op Cecile Hearing, MD   5 mL at 06/25/20 1045    Assessment & Plan:  Pt unchanged *IUP: category I tracing overall *IOL: pt desires rpt c/s and I agree with assessment given unchanged cervix since 0500 on pitocin with adequate UCs. Pt consented for for rpt c/s for arrest of dilation. Pitocin already off. Can proceed when OR is ready. Will do ancef and azithro *GBS: neg *Analgesia: epidural in place. Not working great. Anesthesia aware.   Interpreter used   Bing, Montez Hageman. MD Attending [facility] [employer] Midwife)

## 2020-06-25 NOTE — Progress Notes (Signed)
Labor Progress Note Kathryn Rollins is a 29 y.o. G3P1011 at [redacted]w[redacted]d presented for IOL-TOLAC.  S: Patient complaining of ongoing, chronic back pain. She repeatedly attempted to get out of bed stating that she wanted to ambulate, explained that it is safe to stay in bed given the epidural. Husband present at bedside.    O:  BP 124/73   Pulse (!) 102   Temp 98.7 F (37.1 C) (Oral)   Resp 18   Ht 5' 1.81" (1.57 m)   Wt 88.9 kg   LMP 09/19/2019   SpO2 99%   BMI 36.07 kg/m  EFM: 140 bpm/moderate variability/+accels with few variable decels Toco: q2 min  CVE: Dilation: 6 Effacement (%): 80 Station: -1 Presentation: Vertex Exam by:: Tresea Mall, RN   A&P: 29 y.o. [id number] [redacted]w[redacted]d presented for IOL-TOLAC. #IOL/TOLAC: Pitocin initiated at 1430, currently at 12 milli-units/min and will continue to titrate appropriately. FB placed at 1730 and out at 2225. AROM with light meconium stained fluid and IPUC placed at 0130.  #Pain: epidural #FWB: category 2 given presence of a few variable decels which have resolved, overall reassuring.  #GBS negative #MOF: breast/bottle #MOC: POPs #Circ: yes  # Language barrier: preferred language [place]. Has had multiple discussions with multiple providers regarding induction process and TOLAC process. Interpretation assistance from virtual interpretor along with husband. #hx of pre-eclampsia: prenatal Pre-E labs were wnl. Normotensive BP at 124/73 most recently. Continue to monitor BP. Asymptomatic. #LGA: SD precautions at delivery     Reece Leader, DO 6:31 AM

## 2020-06-25 NOTE — Progress Notes (Addendum)
Labor Progress Note Kathryn Rollins is a 29 y.o. G3P1011 at [redacted]w[redacted]d presented for IOL-TOLAC.  S: Patient doing better since receiving epidural, starting to feel more pressure. Denies any other concerns. Husband present at bedside.   O:  BP 114/75   Pulse (!) 109   Temp 99.7 F (37.6 C) (Oral)   Resp 18   Ht 5' 1.81" (1.57 m)   Wt 88.9 kg   LMP 09/19/2019   SpO2 99%   BMI 36.07 kg/m  EFM: 135 bpm/moderate variability/+accels without decels Toco: q2-3 min  CVE: Dilation: 4.5 Effacement (%): 80 Station: -1 Presentation: Vertex Exam by:: Quin Lofton, RN   A&P: 29 y.o. [id number] [redacted]w[redacted]d presented for IOL-TOLAC. #IOL/TOLAC: Pitocin initiated at 1430 and will continue to titrate appropriately. FB placed at 1730 and out at 2225. AROM with light meconium stained fluid and IPUC placed at 0130.  #Pain: epidural #FWB: category 1 #GBS negative #MOF: breast/bottle #MOC: POPs #Circ: yes  # Language barrier: preferred language [place]. Has had multiple discussions with multiple providers regarding induction process and TOLAC process. Interpretation assistance from virtual interpretor along with husband. #hx of pre-eclampsia: prenatal Pre-E labs were wnl. Normotensive BP at 114/75 most recently. Continue to monitor BP. Asymptomatic. #LGA: SD precautions at delivery     Reece Leader, DO 1:46 AM    Addendum:   Reviewed patient's chart. Patient had primary LTCS in 2019 for NRFHT remote from delivery. Baby weighed 2285g, not 5000 g. Confirmed this with patient and partner at bedside. Have updated chart as necessary.   Casper Harrison, MD [facility] Family Medicine Fellow, [facility] for [facility], [facility]

## 2020-06-25 NOTE — Anesthesia Procedure Notes (Deleted)
Epidural

## 2020-06-25 NOTE — Progress Notes (Signed)
Kathryn Rollins is a 29 y.o. G3P1011 at [redacted]w[redacted]d.  Subjective: Still very uncomfortable in upper and lower back.   Phone interpreter used.   Objective: BP 121/80   Pulse (!) 101   Temp 99.4 F (37.4 C) (Oral)   Resp 16   Ht 5' 1.81" (1.57 m)   Wt 88.9 kg   LMP 09/19/2019   SpO2 99%   BMI 36.07 kg/m    FHT:  FHR: 135 bpm, variability: mod,  accelerations:  10x10,  decelerations:  none UC:   Q 2-7 minutes, mod-strong. Pitocin turned off due to pt continued pain and leaning toward C/S.  Dilation: 6 Effacement (%): Thick Station: Ballotable Presentation: Vertex Exam by:: CNM V Lynsey Ange  Labs: No results found for this or any previous visit (from the past 24 hour(s)).  Assessment / Plan: [redacted]w[redacted]d week IUP Labor: Arrest of dilation and descent. MVU's adequate x 7 hours w/ no change. Cervix now very swollen, further suggesting that baby is too large for pelvis.  Fetal Wellbeing:  Category I  Pain Control:  Epidural not working adequately.  Anticipated MOD: Pt agreeable to C/S and gives verbal consent. Dr. Vergie Living at [facility] discussing R/B/I.  Prep OR.   Katrinka Blazing, [place] 06/25/2020 12:39 PM

## 2020-06-25 NOTE — Anesthesia Procedure Notes (Signed)
Epidural Patient location during procedure: OR Start time: 06/25/2020 1:04 PM End time: 06/25/2020 1:24 PM  Staffing Anesthesiologist: Cecile Hearing, MD Performed: anesthesiologist   Preanesthetic Checklist Completed: patient identified, IV checked, risks and benefits discussed, monitors and equipment checked, pre-op evaluation and timeout performed  Epidural Patient position: sitting Prep: DuraPrep Patient monitoring: blood pressure and continuous pulse ox Approach: midline Location: L3-L4 Injection technique: LOR air  Needle:  Needle type: Tuohy  Needle gauge: 17 G Needle length: 9 cm Needle insertion depth: 6 cm Catheter size: 19 Gauge Catheter at skin depth: 11 cm Test dose: negative and Other (1% Lidocaine)  Additional Notes Failed spinal in OR. Epidural placed and dosed prior to proceeding with C-section. Reason for block:procedure for pain

## 2020-06-25 NOTE — Anesthesia Postprocedure Evaluation (Signed)
Anesthesia Post Note  Patient: Kadeisha Paull  Procedure(s) Performed: CESAREAN SECTION (N/A )     Patient location during evaluation: PACU Anesthesia Type: Epidural Level of consciousness: awake, awake and alert and oriented Pain management: pain level controlled Vital Signs Assessment: post-procedure vital signs reviewed and stable Respiratory status: spontaneous breathing, nonlabored ventilation and respiratory function stable Cardiovascular status: stable Postop Assessment: no headache, no backache, epidural receding, patient able to bend at knees, no signs of nausea or vomiting and no apparent nausea or vomiting Anesthetic complications: no   No complications documented.  Last Vitals:  Vitals:   06/25/20 1558 06/25/20 1618  BP:  (!) 127/91  Pulse: (!) 110 95  Resp: (!) 24 (!) 21  Temp:  36.7 C  SpO2: 97%     Last Pain:  Vitals:   06/25/20 1618  TempSrc:   PainSc: 0-No pain   Pain Goal:                   Cecile Hearing

## 2020-06-25 NOTE — Op Note (Signed)
Operative Note   SURGERY DATE: 06/25/2020  PRE-OP DIAGNOSIS:  *Pregnancy at 40/0 *Failed trial of labor after cesarean section *Arrest of dilation at 6cm *Moderate meconium  POST-OP DIAGNOSIS: Same. Delivered. Direct OP. Moderate caput.   PROCEDURE: Repeat low transverse cesarean section via pfannenstiel skin incision with double layer uterine closure  SURGEON: Surgeon(s) and Role:    Bolivar Bing, MD - Primary  ASSISTANT: None  ANESTHESIA: epidural removed, spinal done, and then epidural done again.   ESTIMATED BLOOD LOSS:  DRAINS: UOP via indwelling foley  TOTAL IV FLUIDS: crystalloid  VTE PROPHYLAXIS: SCDs to bilateral lower extremities  ANTIBIOTICS: Two grams of Cefazolin were given., within 1 hour of skin incision and azithromycin 500mg  IV x 1 given after that  SPECIMENS: none  COMPLICATIONS: None  FINDINGS: Of note, there were no intra-abdominal adhesions were noted. Grossly normal uterus, tubes and ovaries. Moderate meconium stained amniotic fluid, cephalic, not engaged in the pelvis, moderate caput female infant, weight 3915gm, APGARs 7/9, intact placenta.  PROCEDURE IN DETAIL: The patient was taken to the operating room where anesthesia was administered and normal fetal heart tones were confirmed. She was then prepped and draped in the normal fashion in the dorsal supine position with a leftward tilt.  After a time out was performed, the keloid low transverse old skin incision was excised. Then, with the scalpel, I carried through to the underlying layer of fascia. The fascia was then incised at the midline and this incision was extended laterally with the mayo scissors. Attention was turned to the superior aspect of the fascial incision which was grasped with the kocher clamps x 2, tented up and the rectus muscles were dissected off with the scalpel. In a similar fashion the inferior aspect of the fascial incision was grasped with the kocher  clamps, tented up and the rectus muscles dissected off with the mayo scissors. The rectus muscles were then separated in the midline and the peritoneum was entered bluntly. The bladder blade was inserted and the vesicouterine peritoneum was identified, tented up and entered with the metzenbaum scissors. This incision was extended laterally and the bladder flap was created digitally. The bladder blade was reinserted.  A low transverse hysterotomy was made with the scalpel until the endometrial cavity was breached and the amniotic sac ruptured, yielding moderate meconium stained amniotic fluid. This incision was extended bluntly and the infant's head, shoulders and body were delivered atraumatically.The cord was clamped x 2 and cut, and the infant was handed to the awaiting pediatricians, after delayed cord clamping was not done due to the meconium.  The placenta was then gradually expressed from the uterus and then the uterus was exteriorized and cleared of all clots and debris. The hysterotomy was repaired with a running suture of 1-0 monocryl3. A second imbricating layer of 1-0 monocryl suture was then placed to achieve excellent hemostasis.   The uterus and adnexa were then returned to the abdomen, and the hysterotomy and all operative sites were reinspected and excellent hemostasis was noted after irrigation and suction of the abdomen with warm saline.  The fascia was reapproximated with 0 Vicryl in a simple running fashion bilaterally. The subcutaneous layer was then reapproximated with interrupted sutures of 2-0 plain gut, and the skin was then closed with 4-0 monocryl, in a subcuticular fashion.  The patient  tolerated the procedure well. Sponge, lap, needle, and instrument counts were correct x 2. The patient was transferred to the recovery room awake, alert  and breathing independently in stable condition.  Cornelia Copa MD Attending Center for [facility])

## 2020-06-26 ENCOUNTER — Encounter (HOSPITAL_COMMUNITY): Payer: Self-pay | Admitting: Obstetrics and Gynecology

## 2020-06-26 LAB — CBC
HCT: 33.8 % — ABNORMAL LOW (ref 36.0–46.0)
Hemoglobin: 10.9 g/dL — ABNORMAL LOW (ref 12.0–15.0)
MCH: 30.6 pg (ref 26.0–34.0)
MCHC: 32.2 g/dL (ref 30.0–36.0)
MCV: 94.9 fL (ref 80.0–100.0)
Platelets: 164 10*3/uL (ref 150–400)
RBC: 3.56 MIL/uL — ABNORMAL LOW (ref 3.87–5.11)
RDW: 13.2 % (ref 11.5–15.5)
WBC: 14.4 10*3/uL — ABNORMAL HIGH (ref 4.0–10.5)
nRBC: 0 % (ref 0.0–0.2)

## 2020-06-26 LAB — CREATININE, SERUM
Creatinine, Ser: 0.93 mg/dL (ref 0.44–1.00)
GFR, Estimated: >60 mL/min (ref >60)

## 2020-06-26 MED ORDER — OXYCODONE HCL 5 MG PO TABS: 5.0000 mg | ORAL_TABLET | Freq: Four times a day (QID) | ORAL | Status: DC | PRN

## 2020-06-26 MED ORDER — IBUPROFEN 800 MG PO TABS
800.0000 mg | ORAL_TABLET | Freq: Four times a day (QID) | ORAL | Status: DC | PRN
  Administered 2020-06-26 – 2020-06-27 (×4): 800 mg via ORAL
  Filled 2020-06-26 (×4): qty 1

## 2020-06-26 NOTE — Progress Notes (Signed)
[place] Interpreter Joni Reining [phone] used to review plan of care with patient.  Also scheduled medications discussed and all questions answered.    Mother was concerned because she did not know where infant was.  Infant currently in Nursery for Circumcision.  Father has been interpreting for mother but will leave [place] in the room so that RN/Staff can communicate clearly with patient.  Mother states the birth certificate lady was in the room when the other lady took the infant and she did not fully understand where the infant was going.

## 2020-06-26 NOTE — Progress Notes (Signed)
Subjective: POD#1 rLTCS  Patient is doing well without complaints. Ambulating without difficulty. Voiding and passing flatus. Tolerating PO. Abdominal pain improved. Vaginal bleeding decreased.  Objective: Vital signs in last 24 hours: Temp:  [98 F (36.7 C)-99.4 F (37.4 C)] 98 F (36.7 C) (12/13 0451) Pulse Rate:  [76-111] 76 (12/13 0451) Resp:  [13-31] 16 (12/13 0451) BP: (95-137)/(68-91) 95/69 (12/13 0451) SpO2:  [95 %-100 %] 99 % (12/13 0451)  Physical Exam:  General: alert, cooperative and no distress Lochia: appropriate Uterine Fundus: firm Incision: honeycomb dressing c/d/i DVT Evaluation: No evidence of DVT seen on physical exam.  Recent Labs    06/24/20 1028 06/26/20 0348  HGB 12.3 10.9*  HCT 36.3 33.8*    Assessment/Plan: POD#1 rLTCS  -doing well, meeting pp milestones  -VSS  -breast and bottle feeding  -POPs for contraception  -desires circ, ordered/consented  -hgb 12.3>10.9  Patient and husband desire discharge tomorrow at 48 hours.  Alric Seton 06/26/2020, 7:22 AM

## 2020-06-27 MED ORDER — NORETHINDRONE 0.35 MG PO TABS: 1.0000 | ORAL_TABLET | Freq: Every day | ORAL | 11 refills | Status: AC

## 2020-06-27 MED ORDER — OXYCODONE HCL 5 MG PO TABS: 5.0000 mg | ORAL_TABLET | Freq: Four times a day (QID) | ORAL | 0 refills | Status: AC | PRN

## 2020-06-27 MED ORDER — IBUPROFEN 800 MG PO TABS: 800.0000 mg | ORAL_TABLET | Freq: Four times a day (QID) | ORAL | 0 refills | Status: AC | PRN

## 2020-06-27 MED ORDER — COCONUT OIL OIL: 1.0000 "application " | TOPICAL_OIL | 0 refills | Status: AC | PRN

## 2020-06-27 NOTE — Progress Notes (Signed)
[employer] used with [employer], [place] [phone] for patient education, questions, menu selections and obtaining permit for husband to be the patient's interpreter at her request.

## 2020-06-27 NOTE — Lactation Note (Signed)
This note was copied from a baby's chart. Lactation Consultation Note  Patient Name: Kathryn Rollins Today's Date: 06/27/2020 Reason for consult: Initial assessment  Two female interpreters were used for the following consult [name] [phone] whom was unexplicably disconnected from the interpreter's end) and [name] [phone].   Infant is 81 days old (infant was 60 hrs old at time of consult), but Mom wanted to see a Lactation Consultant before leaving the hospital. Mom says that latching hurts. A few latch attempts were done, but it was noted that Mom has larger nipples & infant is unable to get enough into his mouth for an effective, comfortable latch.   Infant was bottle-fed, but switched to an extra-slow flow nipple, as he found the Enfamil slow flow nipple too fast. He did much better with the extra-slow flow nipple & the lactation student & I educated Mom on the signs that a nipple has a good flow rate. I also gave Mom some suggestions as to some brands she could try, as the bottle she had brought from home was not well-marked and likely a fast-flow bottle.  I instructed Mom to pump so that she has milk to put in a bottle. Mom was comfortable with the size 30 flange (lightly lubricated with coconut oil beforehand). A small amount of colostrum was noted when pumping for flange sizing.   Mom was encouraged to keep trying with latching b/c he will be able to do better as he grows. With 2 different interpreters, we tried to clarify her lactation history with her 1st child (who is now 85 yo). Per Mom, it took about 2 months for her milk to come in and when it did come in, she could only pump about 1 oz. Mom nursed for 5 months. Mom did make a comment that her milk came in earlier this time than last time (based on Mom's breasts, Mom is referring to the colostral phase).   Mom has our brochure to call us, if needed.  Lurline Hare [facility] 06/27/2020, 11:46 AM

## 2020-06-27 NOTE — Discharge Summary (Signed)
Postpartum Discharge Summary  Date of Service updated 06/27/20     Patient Name: Kathryn Rollins DOB: Oct 29, 1990 MRN: [id number]  Date of admission: 06/24/2020 Delivery date:06/25/2020  Delivering provider: Aletha Halim  Date of discharge: 06/27/2020  Admitting diagnosis: History of cesarean delivery, currently pregnant [O34.219] Intrauterine pregnancy: [redacted]w[redacted]d    Secondary diagnosis:  Active Problems:   Supervision of high risk pregnancy, antepartum   History of pre-eclampsia in prior pregnancy, currently pregnant   Language barrier   Elevated blood lead level   History of cesarean section   Alpha thalassemia silent carrier   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 yo [id number] admitted to the hospital 06/24/2020 for induction of labor. Patient had a labor course significant for failed TOLAC. The patient went for cesarean section due to Arrest of Dilation. Delivery details are as follows: Membrane Rupture Time/Date: 1:28 AM ,06/25/2020   Delivery Method:C-Section, Low Transverse  Details of operation can be found in separate operative Note.  Patient had an uncomplicated postpartum course. She is ambulating, tolerating a regular diet, passing flatus, and urinating well.  Patient is discharged home in stable condition on 06/27/20.      Newborn Data: Birth date:06/25/2020  Birth time:1:58 PM  Gender:Female  Living status:Living  Apgars:7 ,9  Weight:3915 g                                 Magnesium Sulfate received: No BMZ received:  No Rhophylac:N/A MMR:N/A T-DaP:declined Flu: No Transfusion:No  Physical exam  Vitals:   06/26/20 0451 06/26/20 1912 06/26/20 2013 06/27/20 0600  BP: 95/69 105/71 107/80 120/78  Pulse: 76 87 87 78  Resp: _0 Temp: 98 F (36.7 C) 98.4 F (36.9 C) 98.3 F (36.8 C) 98.1 F (36.7 C)  TempSrc: Oral Oral Oral Oral  SpO2: 99% 99% 99% 100%  Weight:      Height:       General: alert, cooperative and no distress Lochia: appropriate Uterine Fundus: firm Incision: Dressing is clean, dry, and intact DVT Evaluation: No evidence of DVT seen on physical exam. Labs: Lab Results  Component Value Date   WBC 14.4 (H) 06/26/2020   HGB 10.9 (L) 06/26/2020   HCT 33.8 (L) 06/26/2020   MCV 94.9 06/26/2020   PLT 164 06/26/2020   CMP Latest Ref Rng & Units 06/26/2020  Glucose 65 - 99 mg/dL -  BUN 6 - 20 mg/dL -  Creatinine 0.44 - 1.00 mg/dL 0.93  Sodium 134 - 144 mmol/L -  Potassium 3.5 - 5.2 mmol/L -  Chloride 96 - 106 mmol/L -  CO2 20 - 29 mmol/L -  Calcium 8.7 - 10.2 mg/dL -  Total Protein 6.0 - 8.5 g/dL -  Total Bilirubin 0.0 - 1.2 mg/dL -  Alkaline Phos 48 - 121  IU/L -  AST 0 - 40 IU/L -  ALT 0 - 32 IU/L -   Edinburgh Score: Edinburgh Postnatal Depression Scale Screening Tool 06/26/2020  I have been able to laugh and see the funny side of things. 0  I have looked forward with enjoyment to things. 0  I have blamed myself unnecessarily when things went wrong. 0  I have been anxious or worried for no good reason. 0  I have felt scared or panicky for no good reason. 0  Things have been getting on top of me. 0  I have been so unhappy that I have had difficulty sleeping. 1  I have felt sad or miserable. 0  I have been so unhappy that I have been crying. 0  The thought of harming myself has occurred to me. 0  Edinburgh Postnatal Depression Scale Total 1     After visit meds:  Allergies as of 06/27/2020      Reactions   Other    Medications for malaria cause itching.   Uncertain of names of medications      Medication List    STOP taking these medications   aspirin EC 81 MG tablet     TAKE these medications   acetaminophen 500 MG tablet Commonly known as: TYLENOL Take 500 mg by mouth every 6 (six) hours as needed for mild pain.   Blood Pressure Kit Devi 1 Device by Does not apply route as needed.   coconut oil Oil Apply 1 application topically as needed.   ibuprofen 800 MG tablet Commonly known as: ADVIL Take 1 tablet (800 mg total) by mouth every 6 (six) hours as needed for headache, mild pain or cramping (for moderate pain when taking po's).   norethindrone 0.35 MG tablet Commonly known as: Ortho Micronor Take 1 tablet (0.35 mg total) by mouth daily.   oxyCODONE 5 MG immediate release tablet Commonly known as: Oxy IR/ROXICODONE Take 1-2 tablets (5-10 mg total) by mouth every 6 (six) hours as needed for severe pain.   PrePLUS 27-1 MG Tabs Take 1 tablet by mouth daily.        Discharge home in stable condition Infant Feeding: Breast Infant Disposition:home with mother Discharge instruction: per After Visit Summary and Postpartum booklet. Activity: Advance as tolerated. Pelvic rest for 6 weeks.  Diet: routine diet Future Appointments: Future Appointments  Date Time Provider [place]  06/30/2020  9:30 AM [facility] None   Follow up Visit: Message sent to [facility] 06/27/20 by Sylvester Harder.   Please schedule this patient for a In person postpartum visit in 4 weeks with the following provider: Any provider. Additional Postpartum F/U:Incision check 1 week  Low risk pregnancy complicated by: none Delivery mode:  C-Section, Low Transverse  Anticipated Birth Control:  POPs, rx sent at discharge   58/44/1712 Arrie Senate, MD

## 2020-06-27 NOTE — Discharge Instructions (Signed)
-take tylenol 1000 mg every 6 hours and ibuprofen 600 mg every 4-6 hours for pain, take oxycodone for breakthrough pain -drink plenty of water! -take miralax and stool softeners to avoid constipation -take birth control pills at the same time daily  Postpartum Care After Cesarean Delivery This sheet gives you information about how to care for yourself from the time you deliver your baby to up to 6-12 weeks after delivery (postpartum period). Your health care provider may also give you more specific instructions. If you have problems or questions, contact your health care provider. Follow these instructions at home: Medicines  Take over-the-counter and prescription medicines only as told by your health care provider.  If you were prescribed an antibiotic medicine, take it as told by your health care provider. Do not stop taking the antibiotic even if you start to feel better.  Ask your health care provider if the medicine prescribed to you: ? Requires you to avoid driving or using heavy machinery. ? Can cause constipation. You may need to take actions to prevent or treat constipation, such as:  Drink enough fluid to keep your urine pale yellow.  Take over-the-counter or prescription medicines.  Eat foods that are high in fiber, such as beans, whole grains, and fresh fruits and vegetables.  Limit foods that are high in fat and processed sugars, such as fried or sweet foods. Activity  Gradually return to your normal activities as told by your health care provider.  Avoid activities that take a lot of effort and energy (are strenuous) until approved by your health care provider. Walking at a slow to moderate pace is usually safe. Ask your health care provider what activities are safe for you. ? Do not lift anything that is heavier than your baby or 10 lb (4.5 kg) as told by your health care provider. ? Do not vacuum, climb stairs, or drive a car for as long as told by your health care  provider.  If possible, have someone help you at home until you are able to do your usual activities yourself.  Rest as much as possible. Try to rest or take naps while your baby is sleeping. Vaginal bleeding  It is normal to have vaginal bleeding (lochia) after delivery. Wear a sanitary pad to absorb vaginal bleeding and discharge. ? During the first week after delivery, the amount and appearance of lochia is often similar to a menstrual period. ? Over the next few weeks, it will gradually decrease to a dry, yellow-brown discharge. ? For most women, lochia stops completely by 4-6 weeks after delivery. Vaginal bleeding can vary from woman to woman.  Change your sanitary pads frequently. Watch for any changes in your flow, such as: ? A sudden increase in volume. ? A change in color. ? Large blood clots.  If you pass a blood clot, save it and call your health care provider to discuss. Do not flush blood clots down the toilet before you get instructions from your health care provider.  Do not use tampons or douches until your health care provider says this is safe.  If you are not breastfeeding, your period should return 6-8 weeks after delivery. If you are breastfeeding, your period may return anytime between 8 weeks after delivery and the time that you stop breastfeeding. Perineal care   If your C-section (Cesarean section) was unplanned, and you were allowed to labor and push before delivery, you may have pain, swelling, and discomfort of the tissue between your vaginal  opening and your anus (perineum). You may also have an incision in the tissue (episiotomy) or the tissue may have torn during delivery. Follow these instructions as told by your health care provider: ? Keep your perineum clean and dry as told by your health care provider. Use medicated pads and pain-relieving sprays and creams as directed. ? If you have an episiotomy or vaginal tear, check the area every day for signs of  infection. Check for:  Redness, swelling, or pain.  Fluid or blood.  Warmth.  Pus or a bad smell. ? You may be given a squirt bottle to use instead of wiping to clean the perineum area after you go to the bathroom. As you start healing, you may use the squirt bottle before wiping yourself. Make sure to wipe gently. ? To relieve pain caused by an episiotomy, vaginal tear, or hemorrhoids, try taking a warm sitz bath 2-3 times a day. A sitz bath is a warm water bath that is taken while you are sitting down. The water should only come up to your hips and should cover your buttocks. Breast care  Within the first few days after delivery, your breasts may feel heavy, full, and uncomfortable (breast engorgement). You may also have milk leaking from your breasts. Your health care provider can suggest ways to help relieve breast discomfort. Breast engorgement should go away within a few days.  If you are breastfeeding: ? Wear a bra that supports your breasts and fits you well. ? Keep your nipples clean and dry. Apply creams and ointments as told by your health care provider. ? You may need to use breast pads to absorb milk leakage. ? You may have uterine contractions every time you breastfeed for several weeks after delivery. Uterine contractions help your uterus return to its normal size. ? If you have any problems with breastfeeding, work with your health care provider or a Advertising copywriter.  If you are not breastfeeding: ? Avoid touching your breasts as this can make your breasts produce more milk. ? Wear a well-fitting bra and use cold packs to help with swelling. ? Do not squeeze out (express) milk. This causes you to make more milk. Intimacy and sexuality  Ask your health care provider when you can engage in sexual activity. This may depend on your: ? Risk of infection. ? Healing rate. ? Comfort and desire to engage in sexual activity.  You are able to get pregnant after delivery,  even if you have not had your period. If desired, talk with your health care provider about methods of family planning or birth control (contraception). Lifestyle  Do not use any products that contain nicotine or tobacco, such as cigarettes, e-cigarettes, and chewing tobacco. If you need help quitting, ask your health care provider.  Do not drink alcohol, especially if you are breastfeeding. Eating and drinking   Drink enough fluid to keep your urine pale yellow.  Eat high-fiber foods every day. These may help prevent or relieve constipation. High-fiber foods include: ? Whole grain cereals and breads. ? Brown rice. ? Beans. ? Fresh fruits and vegetables.  Take your prenatal vitamins until your postpartum checkup or until your health care provider tells you it is okay to stop. General instructions  Keep all follow-up visits for you and your baby as told by your health care provider. Most women visit their health care provider for a postpartum checkup within the first 3-6 weeks after delivery. Contact a health care provider if you:  Feel unable to cope with the changes that a new baby brings to your life, and these feelings do not go away.  Feel unusually sad or worried.  Have breasts that are painful, hard, or turn red.  Have a fever.  Have trouble holding urine or keeping urine from leaking.  Have little or no interest in activities you used to enjoy.  Have not breastfed at all and you have not had a menstrual period for 12 weeks after delivery.  Have stopped breastfeeding and you have not had a menstrual period for 12 weeks after you stopped breastfeeding.  Have questions about caring for yourself or your baby.  Pass a blood clot from your vagina. Get help right away if you:  Have chest pain.  Have difficulty breathing.  Have sudden, severe leg pain.  Have severe pain or cramping in your abdomen.  Bleed from your vagina so much that you fill more than one sanitary  pad in one hour. Bleeding should not be heavier than your heaviest period.  Develop a severe headache.  Faint.  Have blurred vision or spots in your vision.  Have a bad-smelling vaginal discharge.  Have thoughts about hurting yourself or your baby. If you ever feel like you may hurt yourself or others, or have thoughts about taking your own life, get help right away. You can go to your nearest emergency department or call:  Your local emergency services (911 in the [place]  A suicide crisis helpline, such as the National Suicide Prevention Lifeline at 438-158-1461. This is open 24 hours a day. Summary  The period of time from when you deliver your baby to up to 6-12 weeks after delivery is called the postpartum period.  Gradually return to your normal activities as told by your health care provider.  Keep all follow-up visits for you and your baby as told by your health care provider. This information is not intended to replace advice given to you by your health care provider. Make sure you discuss any questions you have with your health care provider. Document Revised: 02/18/2018 Document Reviewed: 02/18/2018 Elsevier Patient Education  2020 [employer].

## 2020-06-28 ENCOUNTER — Encounter: Payer: Medicaid Other | Admitting: Obstetrics & Gynecology

## 2020-06-30 ENCOUNTER — Other Ambulatory Visit (HOSPITAL_COMMUNITY): Payer: Medicaid Other

## 2020-07-02 ENCOUNTER — Inpatient Hospital Stay (HOSPITAL_COMMUNITY)
Admission: AD | Admit: 2020-07-02 | Payer: Medicaid Other | Source: Home / Self Care | Admitting: Obstetrics and Gynecology

## 2020-07-02 ENCOUNTER — Inpatient Hospital Stay (HOSPITAL_COMMUNITY): Payer: Medicaid Other

## 2020-07-03 ENCOUNTER — Ambulatory Visit: Payer: Medicaid Other

## 2020-07-13 ENCOUNTER — Telehealth: Payer: Self-pay | Admitting: *Deleted

## 2020-07-13 NOTE — Telephone Encounter (Addendum)
VM message left by pt's husband. He stated that they have questions regarding her post delivery and C/S care. He requested that a nurse call [facility]a @ [phone] or [phone]. She is [place] speaking.   1735 Called pt @ [phone] w/[employer] interpreter # [phone]. A message was heard stating that this person has a voicemail which has not been set up yet. Per chart review, pt needs nurse appt for incision check as she did not keep appt scheduled on 12/20.  1/3  1402  Called pt w/[employer] interpreter [phone]. We discussed her medical care following C/S. Per chart review, she did not keep appt as scheduled on 12/20 for incision check. Pt stated she was not aware of the appt. She also stated that previously she had a lot of pain after going home from the hospital. Her pain is much less now. Pt also stated that she has not removed the dressing on the incision. There was previously some bleeding however none now. Pt was informed of need got office appt. She voiced understanding and agreed to appt @ 2pm tomorrow.

## 2020-07-18 ENCOUNTER — Other Ambulatory Visit: Payer: Self-pay

## 2020-07-18 ENCOUNTER — Ambulatory Visit (INDEPENDENT_AMBULATORY_CARE_PROVIDER_SITE_OTHER): Payer: Medicaid Other | Admitting: General Practice

## 2020-07-18 VITALS — BP 100/78 | HR 99 | Ht 60.0 in | Wt 170.0 lb

## 2020-07-18 DIAGNOSIS — Z5189 Encounter for other specified aftercare: Secondary | ICD-10-CM

## 2020-07-18 NOTE — Progress Notes (Signed)
Chart reviewed for nurse visit. Agree with plan of care.   Venora Maples, MD 07/18/20 5:05 PM

## 2020-07-18 NOTE — Progress Notes (Signed)
Patient presents to office today for wound check following repeat c-section on 12/12. Patient reports doing well at home, just has occasional incision and back pain. Honeycomb dressing was still in place so dressing was removed. Incision is clean, dry & intact- appears to be healing well. Wound care and signs & symptoms of infection reviewed with patient. Patient verbalized understanding.  Chase Caller RN BSN 07/18/20

## 2020-07-28 ENCOUNTER — Telehealth: Payer: Self-pay | Admitting: *Deleted

## 2020-07-28 NOTE — Telephone Encounter (Addendum)
VM message left by pt's husband with request for a nurse to call her today with a [place] interpreter. She is having pain with her C/S and has questions. She can be reached @ [phone].  1245  I called pt w/[employer] interpreter [phone] and discussed her concerns. She stated that when she is sleeping, she awakens with pain in her vagina. This happens most every night. She denies pain with urination. She also has itching of her abdomen where she was numb for C/S delivery, however denies having a rash. I advised that her body is still healing from the surgery and hopefully this pain will subside. I recommended that she should empty her bladder when she gets the pain as it may be caused by pressure from a full bladder. She may also try taking Tylenol or Ibuprofen when she gets the pain. I do not know why she would have itching of the abdomen. She should discuss her concerns during scheduled office visit on 1/25. Pt voiced understanding of information provided.

## 2020-08-01 ENCOUNTER — Telehealth: Payer: Self-pay

## 2020-08-01 NOTE — Telephone Encounter (Signed)
Pt left VM on nurse line requesting a callback. Did not report any concerns in VM.

## 2020-08-02 NOTE — Telephone Encounter (Signed)
I called Kathryn Rollins with [employer] [phone] and she reports she wants to see a doctor because she is having pain in her vagina at times. States no pain currently but it comes and goes. Conversation difficult due to crying child and people in background.  She reports pain started 07/19/20 after her nurse visit 07/18/20. She denies intercourse. I asked her to rate pain on 1-10 schedule and she did not. She states no pain at present. I informed her she has an appointment 08/08/20 0935 for her postpartum visit with a doctor and they can examine then. She asked if she can be seen sooner. I informed her appointments very full due to being closed due to snow but I will check with registar. Notified there are no sooner appointments. I notified Kathryn Rollins there are no sooner appointments. I advised she may take tylenol or ibuprofen as needed for pain. She voices understanding. Nilam Quakenbush,RN

## 2020-08-08 ENCOUNTER — Other Ambulatory Visit (HOSPITAL_COMMUNITY)
Admission: RE | Admit: 2020-08-08 | Discharge: 2020-08-08 | Disposition: A | Discharge status: Home / Self Care | Payer: Medicaid Other | Source: Ambulatory Visit | Attending: Family Medicine | Admitting: Family Medicine

## 2020-08-08 ENCOUNTER — Ambulatory Visit (INDEPENDENT_AMBULATORY_CARE_PROVIDER_SITE_OTHER): Payer: Medicaid Other | Admitting: Family Medicine

## 2020-08-08 ENCOUNTER — Other Ambulatory Visit: Payer: Self-pay

## 2020-08-08 VITALS — Wt 172.6 lb

## 2020-08-08 DIAGNOSIS — Z0000 Encounter for general adult medical examination without abnormal findings: Secondary | ICD-10-CM | POA: Insufficient documentation

## 2020-08-08 DIAGNOSIS — K5909 Other constipation: Secondary | ICD-10-CM

## 2020-08-08 DIAGNOSIS — N941 Unspecified dyspareunia: Secondary | ICD-10-CM

## 2020-08-08 NOTE — Progress Notes (Signed)
Post Partum Visit Note  Kathryn Rollins is a 30 y.o. G74P2012 female who presents for a postpartum visit. She is 6 weeks postpartum following a repeat cesarean section.  I have fully reviewed the prenatal and intrapartum course. The delivery was at 40 gestational weeks.  Anesthesia: none. Postpartum course has been uncomplicated. Baby is doing well. Baby is feeding by both breast and bottle - Carnation Good Start. Bleeding no bleeding. Bowel function is Constipation, taking OTC meds, can't remember what. Drinking 1L water daily. Upon further questioning she is having a BM at least every other day. Bladder function is normal. No fecal or urinary incontinence. Patient is not sexually active. She does endorse occasional dyspareunia. Contraception method is oral progesterone-only contraceptive, has not started yet. Postpartum depression screening: negative.    Edinburgh Postnatal Depression Scale - 08/08/20 0953      Edinburgh Postnatal Depression Scale:  In the Past 7 Days   I have been able to laugh and see the funny side of things. 0    I have looked forward with enjoyment to things. 0    I have blamed myself unnecessarily when things went wrong. 0    I have been anxious or worried for no good reason. 0    I have felt scared or panicky for no good reason. 0    Things have been getting on top of me. 0    I have been so unhappy that I have had difficulty sleeping. 0    I have felt sad or miserable. 0    I have been so unhappy that I have been crying. 0    The thought of harming myself has occurred to me. 0    Edinburgh Postnatal Depression Scale Total 0            The following portions of the patient's history were reviewed and updated as appropriate: allergies, current medications, past family history, past medical history, past social history, past surgical history and problem list.  Review of Systems Pertinent items are noted in HPI.    Objective:  Wt 172 lb 9.6 oz (78.3 kg)    LMP 09/19/2019   BMI 33.71 kg/m    General:  alert, cooperative and no distress   Breasts:  not indicated  Lungs: normal respiratory effort  Heart:  regular rate and rhythm  Abdomen: soft, non-tender; bowel sounds normal; no masses,  no organomegaly and incision well appearing, no abnormal discharge or erythema   Vulva:  normal  Vagina: normal vagina  Cervix:  no bleeding following Pap, no cervical motion tenderness, no lesions and normal appearing cervix  Corpus: normal  Adnexa:  not evaluated  Rectal Exam: Not performed.        Assessment:    Normal postpartum exam. Pap smear done at today's visit, last 2019 at [facility]. No reported history of abnormal pap.  Plan:   Essential components of care per ACOG recommendations:  1.  Mood and well being: Patient with negative depression screening today. Reviewed local resources for support.  - Patient does not use tobacco.  - hx of drug use? No   2. Infant care and feeding:  -Patient currently breastmilk feeding? Yes  -Social determinants of health (SDOH) reviewed in EPIC. Food insecurity, patient taken to food pantry  3. Sexuality, contraception and birth spacing - Patient does not want a pregnancy in the next year.  Desired family size is 3 children.  - Reviewed forms of contraception in tiered fashion.  Patient desired oral progesterone-only contraceptive today.   - Discussed birth spacing of 18 months  4. Sleep and fatigue -Encouraged family/partner/community support of 4 hrs of uninterrupted sleep to help with mood and fatigue  5. Physical Recovery  - Discussed patients delivery and complications - Patient has urinary incontinence? No - Patient is not safe to resume physical and sexual activity, discussed waiting until dyspareunia resolves to restart intercourse. Also discussed starting POPs one month prior to initiating intercourse.  6.  Health Maintenance - Last pap smear done 11/2017 and was normal. - Mammogram not  indicated given age and lack of family history  7. Constipation -sounds like patient is having normal stools, discussed increasing hydration, miralax, stool softeners to assist with symptoms  Alric Seton, MD Center for [facility], [facility]

## 2020-08-08 NOTE — Progress Notes (Signed)
Pt Rx BC pills before she left hospital, Pt also states painful intercourse.

## 2020-08-08 NOTE — Addendum Note (Signed)
Addended by: Henrietta Dine on: 08/08/2020 11:39 AM   Modules accepted: Orders

## 2020-08-09 LAB — CERVICOVAGINAL ANCILLARY ONLY
Bacterial Vaginitis (gardnerella): NEGATIVE
Candida Glabrata: NEGATIVE
Candida Vaginitis: NEGATIVE
Chlamydia: NEGATIVE
Comment: NEGATIVE
Comment: NEGATIVE
Comment: NEGATIVE
Comment: NEGATIVE
Comment: NEGATIVE
Comment: NORMAL
Neisseria Gonorrhea: NEGATIVE
Trichomonas: NEGATIVE

## 2020-08-09 LAB — CYTOLOGY - PAP: Diagnosis: NEGATIVE

## 2020-11-14 ENCOUNTER — Telehealth: Payer: Self-pay

## 2020-11-14 NOTE — Telephone Encounter (Signed)
Pt called requesting a call back with a [place] interpreter.  Called pt with [employer] # [phone] and pt informs me that she is having pain in her vagina that she has been having since she delivered her baby.  Pt requests to have an appt as she has been seeing another provider and they recommended that she f/u with her original provider.  I advised pt that I would notify the front office to call her to schedule an appt.  Pt verbalized understanding.    Addison Naegeli, RN  11/14/20

## 2020-11-14 NOTE — Telephone Encounter (Signed)
Opened in Error.

## 2020-11-16 ENCOUNTER — Other Ambulatory Visit (HOSPITAL_COMMUNITY)
Admission: RE | Admit: 2020-11-16 | Discharge: 2020-11-16 | Disposition: A | Discharge status: Home / Self Care | Payer: Medicaid Other | Source: Ambulatory Visit | Attending: Obstetrics and Gynecology | Admitting: Obstetrics and Gynecology

## 2020-11-16 ENCOUNTER — Other Ambulatory Visit: Payer: Self-pay

## 2020-11-16 ENCOUNTER — Encounter: Payer: Self-pay | Admitting: Obstetrics and Gynecology

## 2020-11-16 ENCOUNTER — Ambulatory Visit (INDEPENDENT_AMBULATORY_CARE_PROVIDER_SITE_OTHER): Payer: Medicaid Other | Admitting: Obstetrics and Gynecology

## 2020-11-16 VITALS — BP 103/72 | HR 93 | Ht 63.0 in | Wt 183.1 lb

## 2020-11-16 DIAGNOSIS — Z01419 Encounter for gynecological examination (general) (routine) without abnormal findings: Secondary | ICD-10-CM

## 2020-11-16 DIAGNOSIS — N949 Unspecified condition associated with female genital organs and menstrual cycle: Secondary | ICD-10-CM | POA: Diagnosis not present

## 2020-11-16 NOTE — Progress Notes (Signed)
Pt states pain and irritation on the outside of her Vagina.Pt states that it only hurts when she's laying down.

## 2020-11-16 NOTE — Progress Notes (Signed)
Patient ID: Kathryn Rollins, female   DOB: 08-15-1990, 30 y.o.   MRN: [id number] [name]  presents with c/o external genitalia  Pain. She reports the pain only occurs when she lies on her right or left side, not when on her back. Denies any bowel or bladder dysfunction. No pain with intercourse. Cycles are regular, monthly Breast feeding  PE AF VSS Lungs clear  Heart  RRR Abd soft, + BS GU Nl EGBUS, no swelling, no lesions, non tender, vestibular non tender, vaginal mucosa well rugated, normal appearing vaginal discharge, cervix no lesions, bimanual uterus small, mobile, non tender, no adnexal masses or tenderness  A/P Pain female external genitalia PE is normal. No specific cause for pt's Sx identified. Will refer to GYN PT for further eval and potential treatment. F/U PRN Live interrupter used during today's visit

## 2020-11-17 LAB — CERVICOVAGINAL ANCILLARY ONLY
Bacterial Vaginitis (gardnerella): NEGATIVE
Candida Glabrata: NEGATIVE
Candida Vaginitis: NEGATIVE
Chlamydia: NEGATIVE
Comment: NEGATIVE
Comment: NEGATIVE
Comment: NEGATIVE
Comment: NEGATIVE
Comment: NEGATIVE
Comment: NORMAL
Neisseria Gonorrhea: NEGATIVE
Trichomonas: NEGATIVE

## 2020-12-18 NOTE — Progress Notes (Signed)
Screened 11/16/20 

## 2021-02-06 ENCOUNTER — Ambulatory Visit: Payer: Medicaid Other | Admitting: Physical Therapy

## 2021-02-09 ENCOUNTER — Ambulatory Visit: Payer: Medicaid Other | Attending: Obstetrics and Gynecology | Admitting: Physical Therapy

## 2021-02-09 ENCOUNTER — Other Ambulatory Visit: Payer: Self-pay

## 2021-02-09 ENCOUNTER — Encounter: Payer: Self-pay | Admitting: Physical Therapy

## 2021-02-09 DIAGNOSIS — R2689 Other abnormalities of gait and mobility: Secondary | ICD-10-CM | POA: Diagnosis present

## 2021-02-09 DIAGNOSIS — M6281 Muscle weakness (generalized): Secondary | ICD-10-CM | POA: Diagnosis present

## 2021-02-09 DIAGNOSIS — R279 Unspecified lack of coordination: Secondary | ICD-10-CM | POA: Diagnosis present

## 2021-02-09 NOTE — Patient Instructions (Signed)
Access Code: [email or web address] URL: [email or web address] Date: 02/09/2021 Prepared by: [facility]  Exercises Supine Diaphragmatic Breathing - 1 x daily - 7 x weekly - 1 sets - 10 reps Hooklying Transversus Abdominis Palpation - 1 x daily - 7 x weekly - 1 sets - 10 reps - 2-3s holds Seated Hamstring Stretch - 1 x daily - 7 x weekly - 1 sets - 2-5 reps - 45s hold Supine Hip Adductor Stretch - 1 x daily - 7 x weekly - 1 sets - 2-5 reps - 45 hold Supine Pelvic Floor Stretch - 1 x daily - 7 x weekly - 1 sets - 2-5 reps - 45 hold Diaphragmatic Breathing in Child's Pose with Pelvic Floor Relaxation - 1 x daily - 7 x weekly - 1 sets - 2-5 reps - 45s hold  [facility] [address] [place], [place] [address] Phone # [phone] Fax [phone]

## 2021-02-09 NOTE — Therapy (Signed)
[facility] [address] [place], [place], [address] Phone: [phone]   Fax:  [phone]  Physical Therapy Evaluation  Patient Details  Name: Kathryn Rollins MRN: [id number] Date of Birth: Dec 12, 1990 Referring Provider (PT): Hermina StaggersErvin, Michael L, MD   Encounter Date: 02/09/2021   PT End of Session - 02/09/21 0902     Visit Number 1    Authorization Type [employer]    Authorization - Visit Number 1    PT Start Time 0807    PT Stop Time 0847    PT Time Calculation (min) 40 min    Activity Tolerance Patient tolerated treatment well    Behavior During Therapy [facility] for tasks assessed/performed             Past Medical History:  Diagnosis Date   COVID-19 affecting pregnancy in second trimester 02/16/2020   Unvaccinated. Had fever and cough 02/07/20 and tested positive on 02/10/20.  Doing well as of 02/16/20.     Elevated blood lead level    History of cesarean delivery, currently pregnant 06/24/2020   Preeclampsia    Tooth pain     Past Surgical History:  Procedure Laterality Date   CESAREAN SECTION     CESAREAN SECTION N/A 06/25/2020   Procedure: CESAREAN SECTION;  Surgeon: [place] BingPickens, Charlie, MD;  Location: [facility] LD ORS;  Service: Obstetrics;  Laterality: N/A;    There were no vitals filed for this visit.    Subjective Assessment - 02/09/21 0811     Subjective Pt reports she has had pain around her vagina since january 2022 mostly at night and when she lays on either Rt or Lt sides. Pt reports pain goes away with mobility, only present with sidelying.    Patient is accompained by: Interpreter    How long can you sit comfortably? no limits    How long can you stand comfortably? no limits    How long can you walk comfortably? no limits    Patient Stated Goals to have less pain    Currently in Pain? No/denies    Pain Score 10-Worst pain ever   when side lying only.   Pain Location Pelvis    Pain Orientation  Medial    Pain Type Chronic pain    Pain Radiating Towards no    Pain Onset More than a month ago    Pain Frequency Intermittent   only with sidelying   Aggravating Factors  sleeping on sides    Pain Relieving Factors laying on back, stomach or with mobility                [facility] - 02/09/21 0001       Assessment   Medical Diagnosis N94.9 (ICD-10-CM) - Pain of female genitalia    Referring Provider (PT) Hermina StaggersErvin, Michael L, MD    Onset Date/Surgical Date --   january 2022   Prior Therapy no      Precautions   Precautions None      Restrictions   Weight Bearing Restrictions No      Balance Screen   Has the patient fallen in the past 6 months No    Has the patient had a decrease in activity level because of a fear of falling?  No    Is the patient reluctant to leave their home because of a fear of falling?  No      Home Tourist information centre managernvironment   Living Environment Private residence  Living Arrangements Spouse/significant other;Children    Additional Comments pt denies problems with stairs      Prior Function   Level of Independence Independent      Cognition   Overall Cognitive Status Within Functional Limits for tasks assessed      Sensation   Light Touch Appears Intact      Coordination   Gross Motor Movements are Fluid and Coordinated Yes    Fine Motor Movements are Fluid and Coordinated Yes      Posture/Postural Control   Posture/Postural Control Postural limitations    Postural Limitations Rounded Shoulders;Posterior pelvic tilt      ROM / Strength   AROM / PROM / Strength AROM;Strength      AROM   Overall AROM  Within functional limits for tasks performed      Strength   Overall Strength Deficits    Strength Assessment Site Hip    Right/Left Hip Right;Left    Right Hip Flexion 4/5    Right Hip Extension 4/5    Right Hip External Rotation  4/5    Right Hip Internal Rotation 4/5    Right Hip ABduction 4/5    Right Hip ADduction 4/5    Left Hip  Flexion 4/5    Left Hip Extension 4/5    Left Hip External Rotation 4/5    Left Hip Internal Rotation 4/5    Left Hip ABduction 4/5    Left Hip ADduction 4/5      Flexibility   Soft Tissue Assessment /Muscle Length yes   limited by 25% in bil adductors and hamstrings     Palpation   Palpation comment pt reported TTP at medial anterior pelvis/lower abdomen and at C-section scar. Noted decreased scar mobility in all directions and fascial restrictions noted in lower abdomen and anterior pelvis                        Objective measurements completed on examination: See above findings.     Pelvic Floor Special Questions - 02/09/21 0001     Are you Pregnant or attempting pregnancy? No    Prior Pregnancies Yes    Number of Pregnancies 3   but two living children; 5months and 2.5 yo   Number of C-Sections 2    Any difficulty with labor and deliveries No    Episiotomy Performed No    Currently Sexually Active No    History of sexually transmitted disease No    Marinoff Scale no problems    Urinary Leakage No    Urinary urgency No    Urinary frequency no    Fecal incontinence No    Falling out feeling (prolapse) No    External Perineal Exam WFL though mild dryness noted    External Palpation pt denied all pain    Prolapse None    Pelvic Floor Internal Exam patient identified and patient confirms consent for PT to perform inernal soft tissue work and muscle strength and integrity assessment    Exam Type Vaginal    Sensation WFL    Palpation pt denied pain throughout internal exam in all quadrants. Noted tightness in bil sides of pelvis at bulbocavernosus, ischiocavernosus, and levator ani    Strength weak squeeze, no lift    Strength # of reps 2    Strength # of seconds 1  PT Education - 02/09/21 0900     Education Details Pt educated on female anatomy with model, exam findings, HEP and POC.    Person(s) Educated Patient     Methods Explanation;Demonstration;Tactile cues;Verbal cues;Handout    Comprehension Verbalized understanding;Returned demonstration              PT Short Term Goals - 02/09/21 0922       PT SHORT TERM GOAL #1   Title Pt to be I with HEP    Time 6    Period Weeks    Target Date 03/23/21      PT SHORT TERM GOAL #2   Title pt to report no more than 6/10 pain with sleeping on sides to decrease overall pain levels and improve sleeping    Time 6    Target Date 03/23/21      PT SHORT TERM GOAL #3   Title pt to demonstrate improved pelvic floor strength to least 4/5 for improved pelvis stability    Time 6    Period Weeks    Status New    Target Date 03/23/21      PT SHORT TERM GOAL #4   Title pt to demonstrate 5/5 bil hip strength in all directions for improved pelvis stability to decrease pain    Time 6    Period Weeks    Status New    Target Date 03/23/21               PT Long Term Goals - 02/09/21 0925       PT LONG TERM GOAL #1   Title pt to be I with advanced HEP    Time 4    Period Months    Status New    Target Date 06/12/21      PT LONG TERM GOAL #2   Title pt to report no more than 4/10 pain with sleeping on sides to improve pt's sleep    Time 4    Period Months    Status New    Target Date 06/12/21      PT LONG TERM GOAL #3   Title pt to demonstrate 5/5 pelvic floor strength in all quadrants for improved pelvic stability to decrease pain    Time 4    Period Months    Target Date 06/12/21      PT LONG TERM GOAL #4   Title pt to demonstrate decreased fascial restrictions in all directions in abdomen and at c-section scar site for improved mobility and decreased pain    Time 4    Period Months    Status New    Target Date 06/12/21                    Plan - 02/09/21 0902     Clinical Impression Statement Pt is 29yo female presenting to clinic with referral for pelvic pain localized to vaginal area and medial pelvis. Pt has had two  C-sections, denies pain with intercourse, and any incontinence. Pt also reports pain is only present with nighttime sleeping and only when sleeping on sides. Pt denied pain when lying on sides during exam and reported it was only at night. Pt found to have decreased c-section scar mobility in all directions, decreased hip flexibility, decreased core activation, pain with lower medial abdomenal quadrant palpation, anterior pelvis palpation, and with internal exam pt found to have no pain per pt however tightness noted throughout pelvis bilaterally, difficulty completeing pelvic  contraction without compensatory muscles groups to assist, poor breathing mechanics with attempts to contract as well. Pt also demonstrated poor ability to maintain contraction longer than 1-2s with mutliple attempts. Pt educated on findings and POC and given HEP. Pt agreeable to english print as [place] not available and repoted her husband is fluent in [place] and can help her if needed, videos also provided. Pt would benefit from PT to address exam findings.    Personal Factors and Comorbidities Comorbidity 1    Comorbidities x2 c-sections    Examination-Activity Limitations Bed Mobility;Other   sleeping   Examination-Participation Restrictions Other   sleeping   Stability/Clinical Decision Making Stable/Uncomplicated    Clinical Decision Making Low    Rehab Potential Good    PT Frequency 1x / week    PT Duration 12 weeks    PT Treatment/Interventions ADLs/Self Care Home Management;Functional mobility training;Taping;Patient/family education;Therapeutic activities;Therapeutic exercise;Neuromuscular re-education;Manual techniques;Energy conservation;Passive range of motion;Scar mobilization    PT Next Visit Plan scar mobilization at c-section, go over HEP    PT Home Exercise Plan Access Code: [email or web address]    Consulted and Agree with Plan of Care Patient             Patient will benefit from skilled therapeutic intervention in  order to improve the following deficits and impairments:  Pain, Impaired flexibility, Improper body mechanics, Postural dysfunction, Decreased strength, Decreased mobility, Decreased endurance, Increased fascial restricitons, Decreased coordination  Visit Diagnosis: Unspecified lack of coordination - Plan: PT plan of care cert/re-cert  Muscle weakness (generalized) - Plan: PT plan of care cert/re-cert  Other abnormalities of gait and mobility - Plan: PT plan of care cert/re-cert     Problem List Patient Active Problem List   Diagnosis Date Noted   Pain of female genitalia 11/16/2020   Alpha thalassemia silent carrier 01/14/2020   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, [address] Kathryn [place], [place], [address] Phone: [phone]   Fax:  [phone]  Name: Kathryn Rollins MRN: [id number] Date of Birth: March 12, 1991

## 2021-02-23 ENCOUNTER — Other Ambulatory Visit: Payer: Self-pay

## 2021-02-23 ENCOUNTER — Ambulatory Visit: Payer: Medicaid Other | Attending: Obstetrics and Gynecology | Admitting: Physical Therapy

## 2021-02-23 DIAGNOSIS — R252 Cramp and spasm: Secondary | ICD-10-CM | POA: Insufficient documentation

## 2021-02-23 DIAGNOSIS — R2689 Other abnormalities of gait and mobility: Secondary | ICD-10-CM | POA: Diagnosis present

## 2021-02-23 DIAGNOSIS — M6281 Muscle weakness (generalized): Secondary | ICD-10-CM | POA: Insufficient documentation

## 2021-02-23 DIAGNOSIS — R278 Other lack of coordination: Secondary | ICD-10-CM | POA: Diagnosis present

## 2021-02-23 DIAGNOSIS — R279 Unspecified lack of coordination: Secondary | ICD-10-CM | POA: Insufficient documentation

## 2021-02-23 NOTE — Therapy (Signed)
[facility] [address] [place], [place], [address] Phone: [phone]   Fax:  [phone]  Physical Therapy Treatment  Patient Details  Name: Kathryn Rollins MRN: [id number] Date of Birth: [birth year] Referring Provider (PT): Hermina Staggers, MD   Encounter Date: 02/23/2021   PT End of Session - 02/23/21 0935     Visit Number 2    Date for PT Re-Evaluation 06/12/21    Authorization Type [employer]    Authorization - Visit Number 2    PT Start Time [id number]   pt arrived late and then had intepreter difficulties   PT Stop Time 0930    PT Time Calculation (min) 27 min    Activity Tolerance Patient tolerated treatment well    Behavior During Therapy [facility] for tasks assessed/performed             Past Medical History:  Diagnosis Date   COVID-19 affecting pregnancy in second trimester 02/16/2020   Unvaccinated. Had fever and cough 02/07/20 and tested positive on 02/10/20.  Doing well as of 02/16/20.     Elevated blood lead level    History of cesarean delivery, currently pregnant 06/24/2020   Preeclampsia    Tooth pain     Past Surgical History:  Procedure Laterality Date   CESAREAN SECTION     CESAREAN SECTION N/A 06/25/2020   Procedure: CESAREAN SECTION;  Surgeon: [place] Bing, MD;  Location: [facility] LD ORS;  Service: Obstetrics;  Laterality: N/A;    There were no vitals filed for this visit.   Subjective Assessment - 02/23/21 0909     Subjective Pt reports she does not sleep on her sides due to pain and unsure if HEP has helped with pain. Pt reports some spotting one week ago with pain and this has happened x2.    Patient is accompained by: Interpreter    How long can you sit comfortably? no limits    How long can you stand comfortably? no limits    How long can you walk comfortably? no limits    Patient Stated Goals to have less pain    Currently in Pain? No/denies                                [facility] Adult PT Treatment/Exercise - 02/23/21 0001       Self-Care   Self-Care Other Self-Care Comments    Other Self-Care Comments  pelvc floor relaxation video and technique discussed and pelvic drops.      Exercises   Exercises Lumbar;Knee/Hip      Lumbar Exercises: Seated   Other Seated Lumbar Exercises pelvic tilts on pool noodle 5x10s each way      Manual Therapy   Manual Therapy Soft tissue mobilization    Soft tissue mobilization scar mobilization in all directions at c-section scar and in lower abdomen.                    PT Education - 02/23/21 0935     Education Details Pt educated on scar massage and pelvic relaxation techniques and to reach out to MD about spotting and pt agreed. intepreter used throughout.    Person(s) Educated Patient    Methods Explanation;Demonstration;Tactile cues;Verbal cues;Handout    Comprehension Verbalized understanding;Returned demonstration              PT Short Term Goals - 02/09/21 0349  PT SHORT TERM GOAL #1   Title Pt to be I with HEP    Time 6    Period Weeks    Target Date 03/23/21      PT SHORT TERM GOAL #2   Title pt to report no more than 6/10 pain with sleeping on sides to decrease overall pain levels and improve sleeping    Time 6    Target Date 03/23/21      PT SHORT TERM GOAL #3   Title pt to demonstrate improved pelvic floor strength to least 4/5 for improved pelvis stability    Time 6    Period Weeks    Status New    Target Date 03/23/21      PT SHORT TERM GOAL #4   Title pt to demonstrate 5/5 bil hip strength in all directions for improved pelvis stability to decrease pain    Time 6    Period Weeks    Status New    Target Date 03/23/21               PT Long Term Goals - 02/09/21 0925       PT LONG TERM GOAL #1   Title pt to be I with advanced HEP    Time 4    Period Months    Status New    Target Date 06/12/21      PT LONG TERM  GOAL #2   Title pt to report no more than 4/10 pain with sleeping on sides to improve pt's sleep    Time 4    Period Months    Status New    Target Date 06/12/21      PT LONG TERM GOAL #3   Title pt to demonstrate 5/5 pelvic floor strength in all quadrants for improved pelvic stability to decrease pain    Time 4    Period Months    Target Date 06/12/21      PT LONG TERM GOAL #4   Title pt to demonstrate decreased fascial restrictions in all directions in abdomen and at c-section scar site for improved mobility and decreased pain    Time 4    Period Months    Status New    Target Date 06/12/21                   Plan - 02/23/21 1019     Clinical Impression Statement Pt presents to clinic with some continued pain with sidelying however she doesn't lay on sides to prevent pain so she is unsure if HEP is helping but does report tightness and intermittent pain at c-section scar sight and medial abdomen. Pt reported she felt better at end of sesion with soft tissue manual work. Pt did report during session she felt spotting x2 last week while having pelvic pain not related to mensural cycle. Pt instructed to contact MD about this and she agreed. Pt also given pelvic floor relaxation video information to assist in tissue relaxation and decreased pelvic pain. Pt would benefit from continued PT for improved pain levels, decreased tightness at pelvic floor and abdomen and c-section scar.    Personal Factors and Comorbidities Comorbidity 1    Comorbidities x2 c-sections    Examination-Participation Restrictions Other    Stability/Clinical Decision Making Stable/Uncomplicated    Clinical Decision Making Low    Rehab Potential Good    PT Frequency 1x / week    PT Duration 12 weeks    PT  Treatment/Interventions ADLs/Self Care Home Management;Functional mobility training;Taping;Patient/family education;Therapeutic activities;Therapeutic exercise;Neuromuscular re-education;Manual  techniques;Energy conservation;Passive range of motion;Scar mobilization    PT Next Visit Plan scar mobilization at c-section, go over HEP    PT Home Exercise Plan Access Code: [email or web address]    Consulted and Agree with Plan of Care Patient             Patient will benefit from skilled therapeutic intervention in order to improve the following deficits and impairments:  Pain, Impaired flexibility, Improper body mechanics, Postural dysfunction, Decreased strength, Decreased mobility, Decreased endurance, Increased fascial restricitons, Decreased coordination  Visit Diagnosis: Other lack of coordination  Other abnormalities of gait and mobility     Problem List Patient Active Problem List   Diagnosis Date Noted   Pain of female genitalia 11/16/2020   Alpha thalassemia silent carrier 01/14/2020   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 [place], [place], [address] Phone: [phone]   Fax:  [phone]  Name: Kathryn Rollins MRN: [id number] Date of Birth: 10-04-1990

## 2021-02-23 NOTE — Patient Instructions (Signed)
[email or web address]://www.youtube.com/watch?[email or web address]   

## 2021-02-27 ENCOUNTER — Ambulatory Visit: Payer: Medicaid Other | Admitting: Physical Therapy

## 2021-02-27 ENCOUNTER — Telehealth: Payer: Self-pay | Admitting: Physical Therapy

## 2021-02-27 NOTE — Telephone Encounter (Signed)
PT called pt with interpreter present to assist in speaking with pt via phone call. Pt reported she forgot about this mornings 10:15am and reports she will be there for next appointment. Pt also educated on clinic's attendance policy and agreeable.   Otelia Sergeant, PT 02/27/2209:36 AM

## 2021-03-09 ENCOUNTER — Other Ambulatory Visit: Payer: Self-pay

## 2021-03-09 ENCOUNTER — Ambulatory Visit: Payer: Medicaid Other | Admitting: Physical Therapy

## 2021-03-09 DIAGNOSIS — R279 Unspecified lack of coordination: Secondary | ICD-10-CM

## 2021-03-09 DIAGNOSIS — M6281 Muscle weakness (generalized): Secondary | ICD-10-CM

## 2021-03-09 DIAGNOSIS — R252 Cramp and spasm: Secondary | ICD-10-CM

## 2021-03-09 DIAGNOSIS — R278 Other lack of coordination: Secondary | ICD-10-CM | POA: Diagnosis not present

## 2021-03-09 NOTE — Therapy (Signed)
[facility] [address] [place], [place], [address] Phone: [phone]   Fax:  [phone]  Physical Therapy Treatment  Patient Details  Name: Kathryn Rollins MRN: [id number] Date of Birth: 1990/12/21 Referring Provider (PT): Hermina Staggers, MD   Encounter Date: 03/09/2021   PT End of Session - 03/09/21 0852     Visit Number 3    Date for PT Re-Evaluation 06/12/21    Authorization Type [employer]    Authorization - Visit Number 3    PT Start Time [id number]    PT Stop Time 0925    PT Time Calculation (min) 39 min    Activity Tolerance Patient tolerated treatment well    Behavior During Therapy [facility] for tasks assessed/performed             Past Medical History:  Diagnosis Date   COVID-19 affecting pregnancy in second trimester 02/16/2020   Unvaccinated. Had fever and cough 02/07/20 and tested positive on 02/10/20.  Doing well as of 02/16/20.     Elevated blood lead level    History of cesarean delivery, currently pregnant 06/24/2020   Preeclampsia    Tooth pain     Past Surgical History:  Procedure Laterality Date   CESAREAN SECTION     CESAREAN SECTION N/A 06/25/2020   Procedure: CESAREAN SECTION;  Surgeon: [place] Bing, MD;  Location: [facility] LD [facility]S;  Service: Obstetrics;  Laterality: N/A;    There were no vitals filed for this visit.   Subjective Assessment - 03/09/21 0851     Subjective Pt reports some pain bil sidelyning still but better than previously. Pt reports no further spotting.    Patient is accompained by: Interpreter    How long can you sit comfortably? no limits    How long can you stand comfortably? no limits    How long can you walk comfortably? no limits    Patient Stated Goals to have less pain    Currently in Pain? No/denies                       No emotional/communication barriers or cognitive limitation. Patient is motivated to learn. Patient understands and agrees  with treatment goals and plan. PT explains patient will be examined in standing, sitting, and lying down to see how their muscles and joints work. When they are ready, they will be asked to remove their underwear so PT can examine their perineum. The patient is also given the option of providing their own chaperone as one is not provided in our facility. The patient also has the right and is explained the right to defer or refuse any part of the evaluation or treatment including the internal exam. With the patient's consent, PT will use one gloved finger to gently assess the muscles of the pelvic floor, seeing how well it contracts and relaxes and if there is muscle symmetry. After, the patient will get dressed and PT and patient will discuss exam findings and plan of care. PT and patient discuss plan of care, schedule, attendance policy and HEP activities.         [facility] Adult PT Treatment/Exercise - 03/09/21 0001       Self-Care   Self-Care Other Self-Care Comments    Other Self-Care Comments  improved relaxation noted at pelvic floor however difficulty coordinating pelvic floor contractions fully, max VC and education on how to complete this and to complete TA activation and kegels at  home with handouts given for this.      Neuro Re-ed    Neuro Re-ed Details  tactile and verbal cues with interpreter for 2x10 TA activations in hooklying; x10 pelvic floor contractions with quick release in all quadrants for improved contraction and muslce recruitment      Manual Therapy   Manual Therapy Soft tissue mobilization;Internal Pelvic Floor    Soft tissue mobilization scar mobilization in all directions at c-section scar and in lower abdomen.                    PT Education - 03/09/21 0934     Education Details Pt educated on TA activation techniques and how to complete Kegels at home, handout and videos for this provided. Pt reported understanding what these were and able to complete them  at home.    Person(s) Educated Patient    Methods Explanation;Demonstration;Tactile cues;Verbal cues;Handout    Comprehension Verbalized understanding;Returned demonstration              PT Short Term Goals - 02/09/21 0922       PT SHORT TERM GOAL #1   Title Pt to be I with HEP    Time 6    Period Weeks    Target Date 03/23/21      PT SHORT TERM GOAL #2   Title pt to report no more than 6/10 pain with sleeping on sides to decrease overall pain levels and improve sleeping    Time 6    Target Date 03/23/21      PT SHORT TERM GOAL #3   Title pt to demonstrate improved pelvic floor strength to least 4/5 for improved pelvis stability    Time 6    Period Weeks    Status New    Target Date 03/23/21      PT SHORT TERM GOAL #4   Title pt to demonstrate 5/5 bil hip strength in all directions for improved pelvis stability to decrease pain    Time 6    Period Weeks    Status New    Target Date 03/23/21               PT Long Term Goals - 02/09/21 0925       PT LONG TERM GOAL #1   Title pt to be I with advanced HEP    Time 4    Period Months    Status New    Target Date 06/12/21      PT LONG TERM GOAL #2   Title pt to report no more than 4/10 pain with sleeping on sides to improve pt's sleep    Time 4    Period Months    Status New    Target Date 06/12/21      PT LONG TERM GOAL #3   Title pt to demonstrate 5/5 pelvic floor strength in all quadrants for improved pelvic stability to decrease pain    Time 4    Period Months    Target Date 06/12/21      PT LONG TERM GOAL #4   Title pt to demonstrate decreased fascial restrictions in all directions in abdomen and at c-section scar site for improved mobility and decreased pain    Time 4    Period Months    Status New    Target Date 06/12/21                   Plan - 03/09/21  1014     Clinical Impression Statement Pt presents to clinic with minimal continued pain at sidelying in bed but has greatly  improved per pt. In preson interpreter present throughout session as needed. Pt requested to focus session on internal pelvic retraining as she feels is unable to completely contract pelvic floor. Pt no longer demonstrated increased tone and educated that relaxation techniques have improved however pt is now demonstrating poor ability to contract TA and pelvic floor with proper coorindation. Pt required max VC and tactile cues to complete with NMRE techniques implemented throughout session to improve muscle recruitment and activation. At end of session pt able to contract TA with better consistency and educated on self assessment to complete HEP for this, HEP updated and pt educated this as well, and given video to further improve pelvic floor contractions at home. C-section scar improved mobility this session. Pt agreed and reporting understanding. Pt would benefit from continued PT for improved pain levels, decreased tightness at pelvic floor and abdomen and c-section scar.    Personal Factors and Comorbidities Comorbidity 1    Comorbidities x2 c-sections    Examination-Activity Limitations Bed Mobility;Other    Stability/Clinical Decision Making Stable/Uncomplicated    Clinical Decision Making Low    Rehab Potential Good    PT Frequency 1x / week    PT Duration 12 weeks    PT Treatment/Interventions ADLs/Self Care Home Management;Functional mobility training;Taping;Patient/family education;Therapeutic activities;Therapeutic exercise;Neuromuscular re-education;Manual techniques;Energy conservation;Passive range of motion;Scar mobilization    PT Next Visit Plan go over HEP, and internal    PT Home Exercise Plan Access Code: [email or web address]    Consulted and Agree with Plan of Care Patient             Patient will benefit from skilled therapeutic intervention in order to improve the following deficits and impairments:  Pain, Impaired flexibility, Improper body mechanics, Postural dysfunction, Decreased  strength, Decreased mobility, Decreased endurance, Increased fascial restricitons, Decreased coordination, Decreased scar mobility  Visit Diagnosis: Muscle weakness (generalized)  Lack of coordination  Cramp and spasm     Problem List Patient Active Problem List   Diagnosis Date Noted   Pain of female genitalia 11/16/2020   Alpha thalassemia silent carrier 01/14/2020   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 [place], [place], [address] Phone: [phone]   Fax:  [phone]  Name: Kathryn Rollins MRN: [id number] Date of Birth: [birth year]

## 2021-03-16 ENCOUNTER — Ambulatory Visit: Payer: Medicaid Other | Attending: Obstetrics and Gynecology | Admitting: Physical Therapy

## 2021-03-16 ENCOUNTER — Other Ambulatory Visit: Payer: Self-pay

## 2021-03-16 ENCOUNTER — Encounter: Payer: Self-pay | Admitting: Physical Therapy

## 2021-03-16 DIAGNOSIS — R279 Unspecified lack of coordination: Secondary | ICD-10-CM | POA: Diagnosis present

## 2021-03-16 DIAGNOSIS — M6281 Muscle weakness (generalized): Secondary | ICD-10-CM | POA: Diagnosis not present

## 2021-03-16 NOTE — Therapy (Signed)
[facility] [address], [address] [place], [place], [address] Phone: [phone]   Fax:  [phone]  Physical Therapy Treatment  Patient Details  Name: Kathryn Rollins MRN: [id number] Date of Birth: 08/11/1990 Referring Provider (PT): Hermina Staggers, MD   Encounter Date: 03/16/2021   PT End of Session - 03/16/21 0928     Visit Number 4    Date for PT Re-Evaluation 06/12/21    Authorization Type [employer]    Authorization - Visit Number 4    PT Start Time 0845    PT Stop Time 0927    PT Time Calculation (min) 42 min    Activity Tolerance Patient tolerated treatment well    Behavior During Therapy [facility] for tasks assessed/performed             Past Medical History:  Diagnosis Date   COVID-19 affecting pregnancy in second trimester 02/16/2020   Unvaccinated. Had fever and cough 02/07/20 and tested positive on 02/10/20.  Doing well as of 02/16/20.     Elevated blood lead level    History of cesarean delivery, currently pregnant 06/24/2020   Preeclampsia    Tooth pain     Past Surgical History:  Procedure Laterality Date   CESAREAN SECTION     CESAREAN SECTION N/A 06/25/2020   Procedure: CESAREAN SECTION;  Surgeon: [place] Bing, MD;  Location: [facility] LD ORS;  Service: Obstetrics;  Laterality: N/A;    There were no vitals filed for this visit.   Subjective Assessment - 03/16/21 0847     Subjective Pt reports pain is better and is now able to tolerate lying on sides for short periods of time.    Patient is accompained by: Interpreter    How long can you sit comfortably? no limits    How long can you walk comfortably? no limits    Patient Stated Goals to have less pain    Currently in Pain? No/denies    Pain Score 6    worst pain during side lying   Pain Location Pelvis    Pain Orientation Medial    Aggravating Factors  on lying on sides but has improved                               [facility]  Adult PT Treatment/Exercise - 03/16/21 0001       Exercises   Exercises Lumbar;Knee/Hip      Lumbar Exercises: Stretches   Other Lumbar Stretch Exercise needle threaders 2x30s      Lumbar Exercises: Standing   Other Standing Lumbar Exercises half kneel 2x30s each leg with rt/lt and trunk extension stretch    Other Standing Lumbar Exercises palloffs red band x10 each      Lumbar Exercises: Supine   Bridge 20 reps    Bridge Limitations 2x10 with TA activations    Other Supine Lumbar Exercises 2x10 TA activations in hooklying      Lumbar Exercises: Quadruped   [place]/Old Horse 10 reps    Other Quadruped Lumbar Exercises quad TA activations 2x10 with tactile cues to complete    Other Quadruped Lumbar Exercises childs pose with lateral leans 2x30s                    PT Education - 03/16/21 0849     Education Details Access Code: [email or web address]. Pt educated on all exercises and kegels during the treatment session  Person(s) Educated Patient    Methods Explanation;Demonstration;Tactile cues;Verbal cues    Comprehension Verbalized understanding;Returned demonstration              PT Short Term Goals - 02/09/21 0922       PT SHORT TERM GOAL #1   Title Pt to be I with HEP    Time 6    Period Weeks    Target Date 03/23/21      PT SHORT TERM GOAL #2   Title pt to report no more than 6/10 pain with sleeping on sides to decrease overall pain levels and improve sleeping    Time 6    Target Date 03/23/21      PT SHORT TERM GOAL #3   Title pt to demonstrate improved pelvic floor strength to least 4/5 for improved pelvis stability    Time 6    Period Weeks    Status New    Target Date 03/23/21      PT SHORT TERM GOAL #4   Title pt to demonstrate 5/5 bil hip strength in all directions for improved pelvis stability to decrease pain    Time 6    Period Weeks    Status New    Target Date 03/23/21               PT Long Term Goals - 02/09/21 0925       PT  LONG TERM GOAL #1   Title pt to be I with advanced HEP    Time 4    Period Months    Status New    Target Date 06/12/21      PT LONG TERM GOAL #2   Title pt to report no more than 4/10 pain with sleeping on sides to improve pt's sleep    Time 4    Period Months    Status New    Target Date 06/12/21      PT LONG TERM GOAL #3   Title pt to demonstrate 5/5 pelvic floor strength in all quadrants for improved pelvic stability to decrease pain    Time 4    Period Months    Target Date 06/12/21      PT LONG TERM GOAL #4   Title pt to demonstrate decreased fascial restrictions in all directions in abdomen and at c-section scar site for improved mobility and decreased pain    Time 4    Period Months    Status New    Target Date 06/12/21                   Plan - 03/16/21 0850     Clinical Impression Statement Pt presenting to clinic reporting she is feeling better with pain in sidelying compared to prior to therapy and now able to tolerate a few minutes with lesser pain. Pt session focused on stretching and pelvic stability wit hTA activation throughout all exercises properly with tactile and VC needed to complete. Pt demonstrated global tightness and benefited from stretching in bil hips, trunk and pelvis to allow improved mobility in all directions and decreased tightness at pelvis. Pt demonsrtated improved activation and coordination with TA during session. Pt given HEP this session with stretches completed during session and agreed to understanding and completing new HEP with kegels at home. Pt would benefit from continued PT for improved pain levels, decreased tightness at pelvic floor and abdomen and c-section scar, improved global strengthening coordination and edurance at pelvic floor as  well.    Personal Factors and Comorbidities Comorbidity 1    Comorbidities x2 c-sections    Examination-Activity Limitations Bed Mobility;Other    Examination-Participation Restrictions  Other    Stability/Clinical Decision Making Stable/Uncomplicated    Clinical Decision Making Low    Rehab Potential Good    PT Frequency 1x / week    PT Duration 12 weeks    PT Treatment/Interventions ADLs/Self Care Home Management;Functional mobility training;Taping;Patient/family education;Therapeutic activities;Therapeutic exercise;Neuromuscular re-education;Manual techniques;Energy conservation;Passive range of motion;Scar mobilization    PT Next Visit Plan go over HEP, and internal    PT Home Exercise Plan Access Code: [email or web address]    Consulted and Agree with Plan of Care Patient             Patient will benefit from skilled therapeutic intervention in order to improve the following deficits and impairments:  Pain, Impaired flexibility, Improper body mechanics, Postural dysfunction, Decreased strength, Decreased mobility, Decreased endurance, Increased fascial restricitons, Decreased coordination, Decreased scar mobility  Visit Diagnosis: Muscle weakness (generalized)  Lack of coordination     Problem List Patient Active Problem List   Diagnosis Date Noted   Pain of female genitalia 11/16/2020   Alpha thalassemia silent carrier 01/14/2020   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 [place], [place], [address] Phone: [phone]   Fax:  [phone]  Name: Kathryn Rollins MRN: [id number] Date of Birth: December 17, 1990

## 2021-03-23 ENCOUNTER — Ambulatory Visit: Payer: Medicaid Other | Admitting: Physical Therapy

## 2021-05-21 IMAGING — US US MFM OB FOLLOW-UP
1 series · 14 of 28 positions shown · non-contrast
Comparison: none

[Series 1: us mfm ob follow-up · 56 acquisitions, 14 frames shown]
[im 3/56]
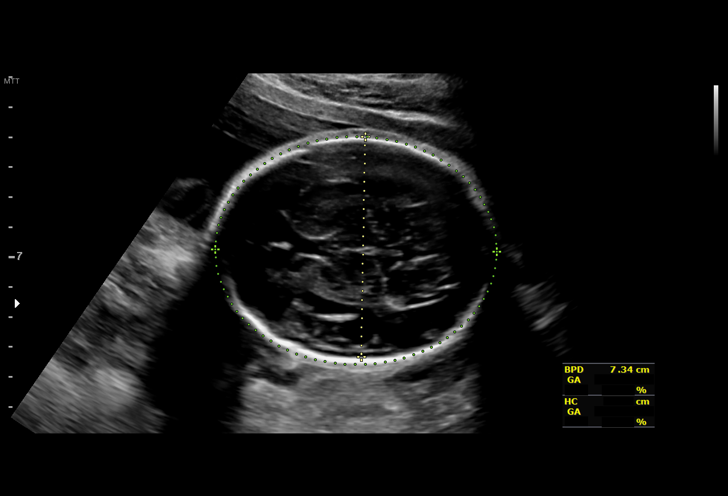
[im 7/56]
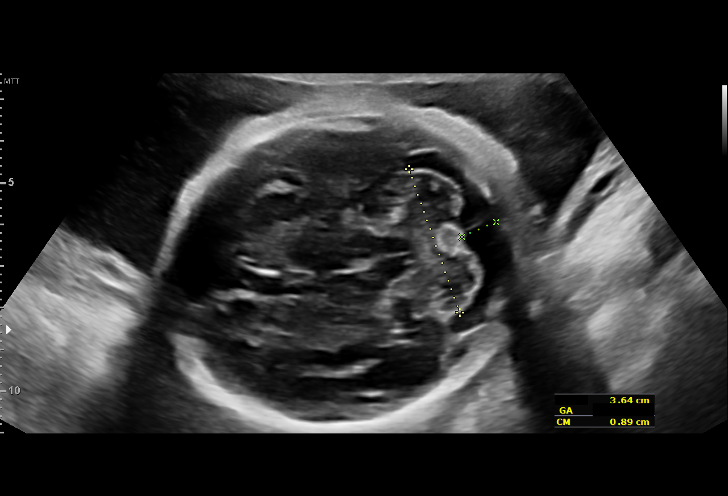
[im 11/56]
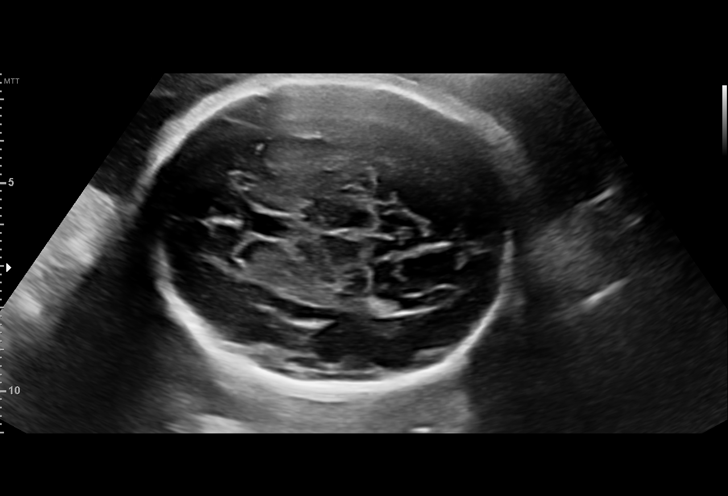
[im 15/56]
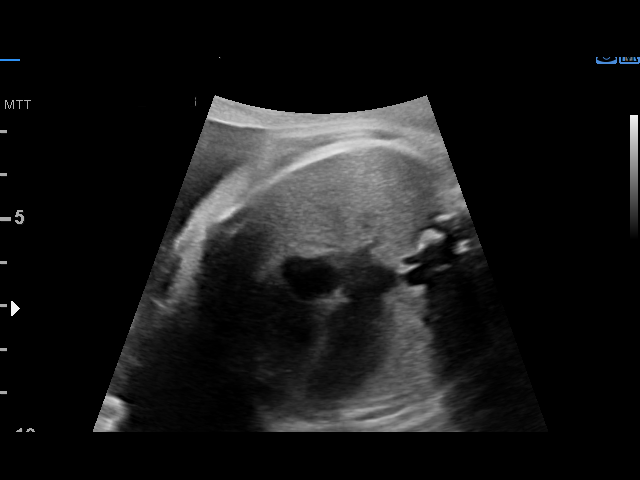
[im 19/56]
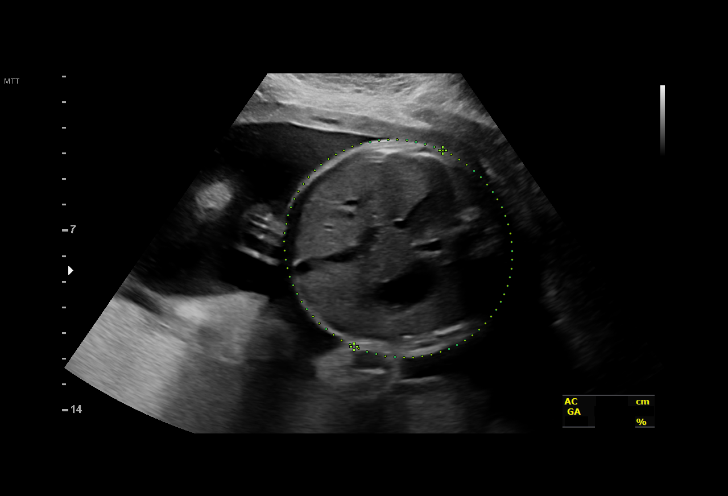
[im 23/56]
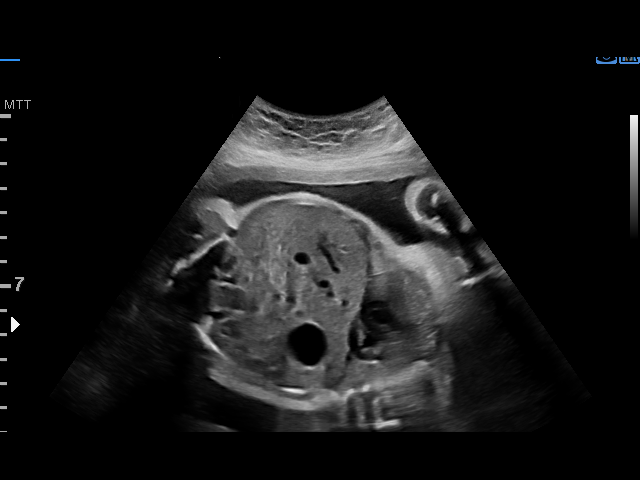
[im 27/56]
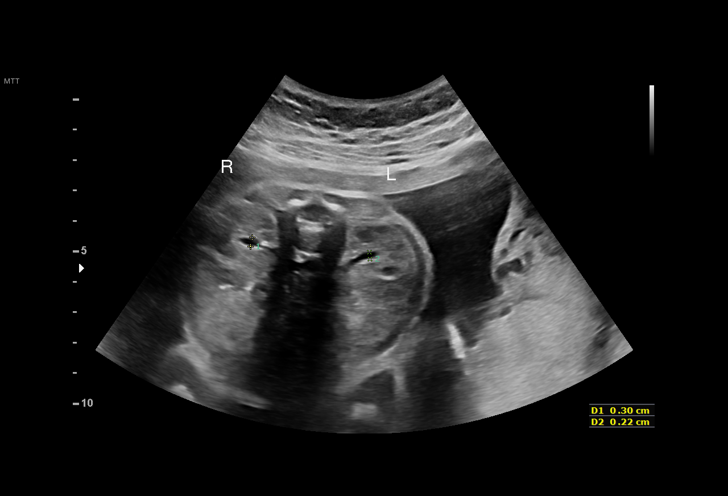
[im 31/56]
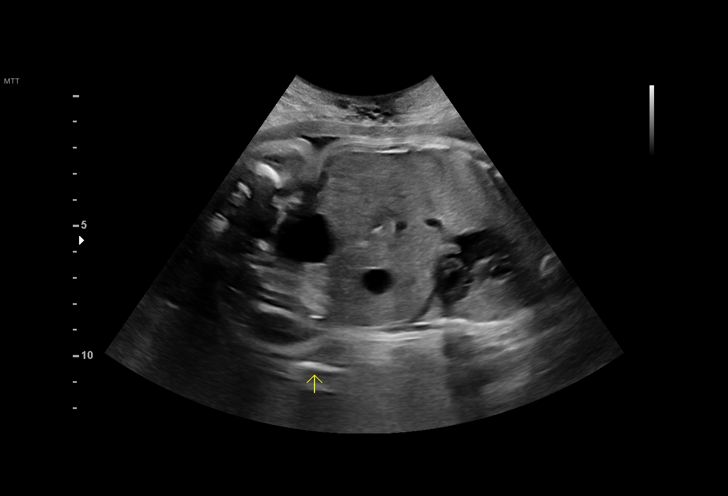
[im 35/56]
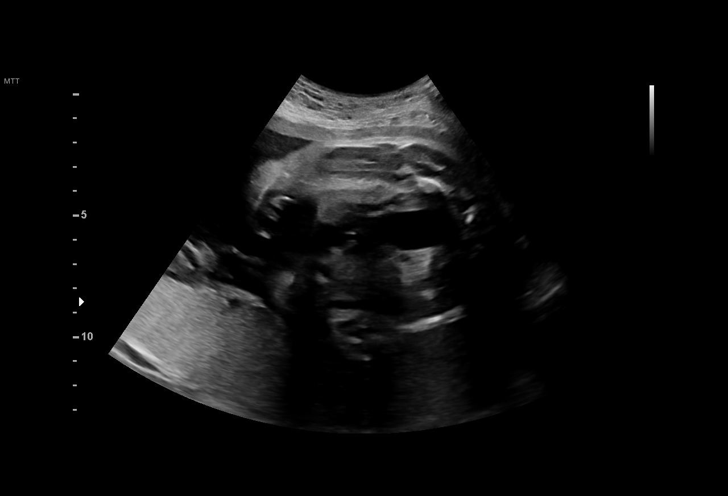
[im 39/56]
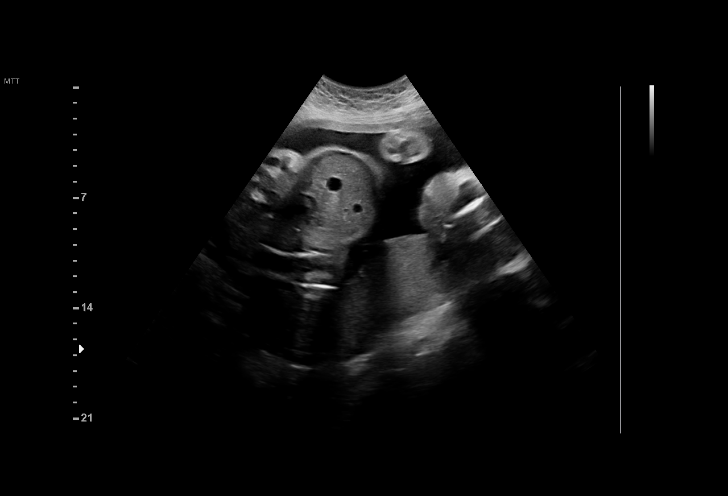
[im 43/56]
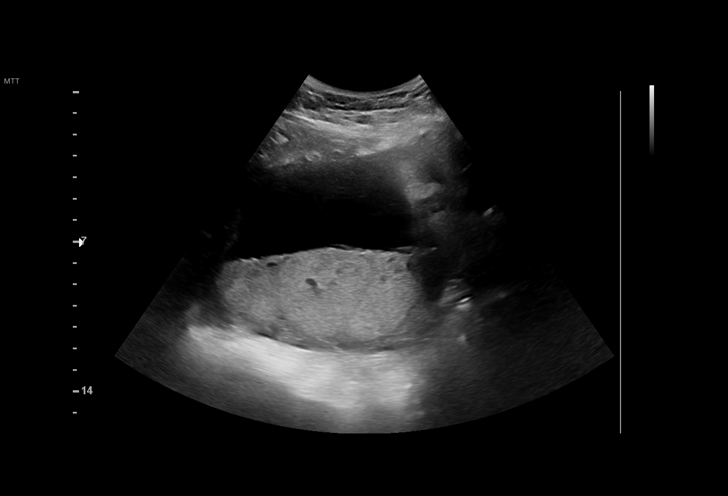
[im 47/56]
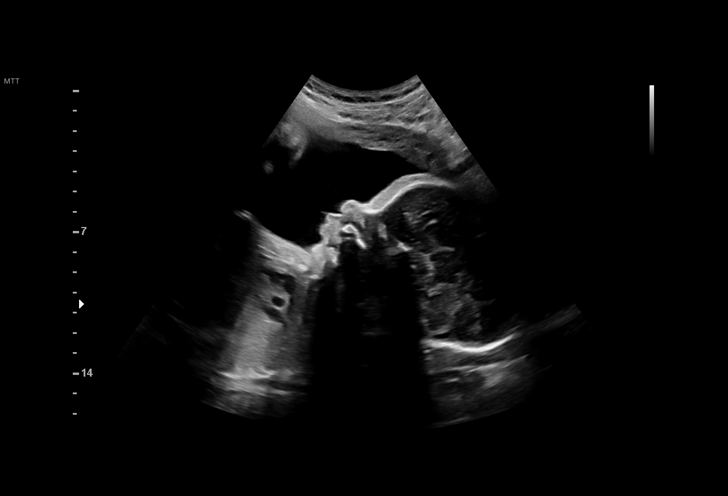
[im 51/56]
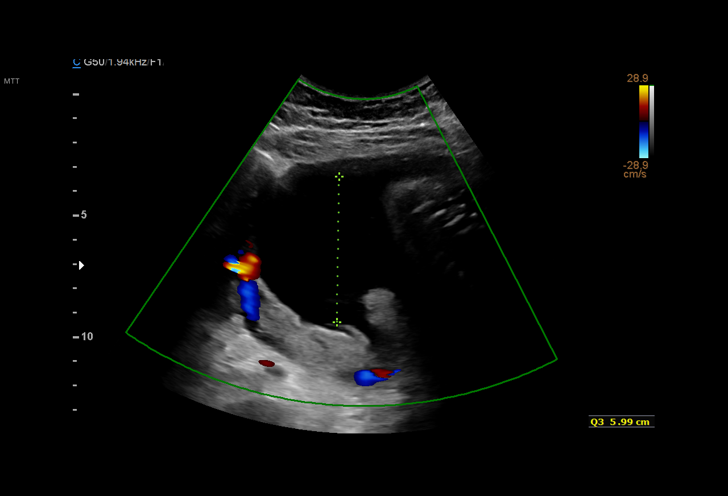
[im 56/56]
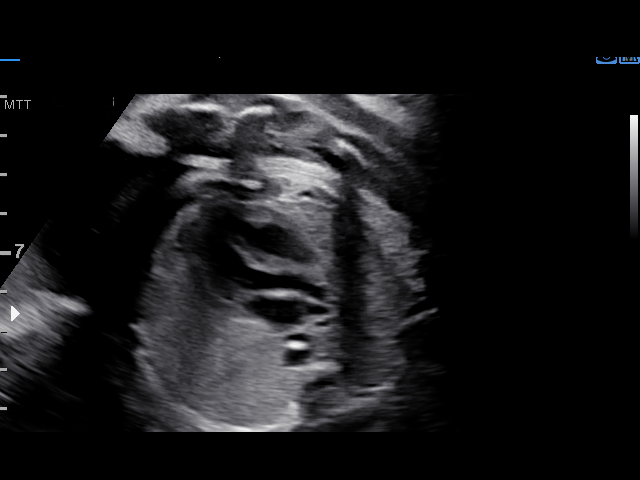

[14 of 28 positions shown; findings below may reference images not displayed]

Referred By:      ZULKARNAIN MALVIN

Indications

 28 weeks gestation of pregnancy
 Genetic carrier (specify) Channing Erik
 Genetic carrier (specify) Alpha Thalassemia
 History of cesarean delivery, currently
 pregnant
 Poor obstetric history: Previous
 preeclampsia / eclampsia/gestational HTN
 Medical complication of pregnancy (Elevated
 Blood Lead level)
 Medical complication of pregnancy (COVID-
 19)
Fetal Evaluation

 Num Of Fetuses:         1
 Fetal Heart Rate(bpm):  151
 Cardiac Activity:       Observed
 Presentation:           Cephalic
 Placenta:               Posterior Fundal
 P. Cord Insertion:      Previously Visualized

 Amniotic Fluid
 AFI FV:      Within normal limits

 AFI Sum(cm)     %Tile       Largest Pocket(cm)
 22.25           91

 RUQ(cm)       RLQ(cm)       LUQ(cm)        LLQ(cm)

Biometry

 BPD:      73.9  mm     G. Age:  29w 5d         72  %    CI:        75.69   %    70 - 86
                                                         FL/HC:      20.3   %    19.6 -
 HC:      269.3  mm     G. Age:  29w 3d         41  %    HC/AC:      0.99        0.99 -
 AC:      270.9  mm     G. Age:  31w 1d         97  %    FL/BPD:     74.0   %    71 - 87
 FL:       54.7  mm     G. Age:  28w 6d         44  %    FL/AC:      20.2   %    20 - 24
 CER:      36.4  mm     G. Age:  30w 2d         90  %

 LV:        5.2  mm
 CM:        8.9  mm

 Est. FW:    2511  gm      3 lb 6 oz     90  %
OB History

 Blood Type:   B+
 Gravidity:    3         Term:   1         SAB:   1
Gestational Age

 LMP:           28w 4d        Date:  09/19/19                 EDD:   06/25/20
 U/S Today:     29w 6d                                        EDD:   06/16/20
 Best:          28w 4d     Det. By:  LMP  (09/19/19)          EDD:   06/25/20
Anatomy

 Cranium:               Previously seen        LVOT:                   Appears normal
 Cavum:                 Previously seen        Aortic Arch:            Previously seen
 Ventricles:            Appears normal         Ductal Arch:            Previously seen
 Choroid Plexus:        Previously seen        Diaphragm:              Appears normal
 Cerebellum:            Previously seen        Stomach:                Appears normal, left
                                                                       sided
 Posterior Fossa:       Previously seen        Abdomen:                Previously seen
 Nuchal Fold:           Previously seen        Abdominal Wall:         Previously seen
 Face:                  Orbits and profile     Cord Vessels:           Previously seen
                        previously seen
 Lips:                  Previously seen        Kidneys:                Appear normal
 Palate:                Previously seen        Bladder:                Appears normal
 Thoracic:              Previously seen        Spine:                  Previously seen
 Heart:                 Appears normal         Upper Extremities:      Previously seen
                        (4CH, axis, and
                        situs)
 RVOT:                  Previously seen        Lower Extremities:      Previously seen

 Other:  Nasal bone, 3VV, 3VTV,. Heels/feet, open hands, and 5th digits
         visualized previously.
Cervix Uterus Adnexa

 Cervix
 Not visualized (advanced GA >32wks)

 Uterus
 No abnormality visualized.

 Right Ovary
 Within normal limits.
 Left Ovary
 Within normal limits.

 Cul De Sac
 No free fluid seen.

 Adnexa
 No abnormality visualized.
Impression

 [place] [employer]. Patient had genetic counseling and opted not to
 have invasive testing. Patient does not have gestational
 diabetes.

 Fetal growth is appropriate for gestational age .Estimated
 fetal weight is at greater than the 90th percentile. Amniotic
 fluid is normal and good fetal activity is seen .

 I explained the findings with help of interpreter present in the
 room.
Recommendations

 Follow-up scans as clinically indicated (if clinical examination
 suggests large-for-gestational age fetus).
                 Traiteur, Tidadi Hbibt
